# Patient Record
Sex: Male | Born: 1980 | Hispanic: No | Marital: Married | Smoking: Former smoker
Health system: Southern US, Community
[De-identification: ages and names within clinical notes are randomized; demographics above are authoritative.]

## PROBLEM LIST (undated history)

## (undated) DIAGNOSIS — G4733 Obstructive sleep apnea (adult) (pediatric): Principal | ICD-10-CM

## (undated) DIAGNOSIS — R011 Cardiac murmur, unspecified: Secondary | ICD-10-CM

## (undated) DIAGNOSIS — B019 Varicella without complication: Secondary | ICD-10-CM

## (undated) DIAGNOSIS — N529 Male erectile dysfunction, unspecified: Secondary | ICD-10-CM

## (undated) DIAGNOSIS — N200 Calculus of kidney: Secondary | ICD-10-CM

## (undated) HISTORY — PX: CLAVICLE SURGERY: SHX598

## (undated) HISTORY — DX: Obstructive sleep apnea (adult) (pediatric): G47.33

## (undated) HISTORY — DX: Cardiac murmur, unspecified: R01.1

## (undated) HISTORY — DX: Calculus of kidney: N20.0

## (undated) HISTORY — DX: Varicella without complication: B01.9

## (undated) HISTORY — PX: SHOULDER SURGERY: SHX246

## (undated) HISTORY — PX: HERNIA REPAIR: SHX51

## (undated) HISTORY — DX: Male erectile dysfunction, unspecified: N52.9

---

## 1998-05-10 ENCOUNTER — Emergency Department (HOSPITAL_COMMUNITY): Admission: EM | Admit: 1998-05-10 | Discharge: 1998-05-10 | Payer: Self-pay | Admitting: Emergency Medicine

## 1998-05-11 ENCOUNTER — Emergency Department (HOSPITAL_COMMUNITY): Admission: EM | Admit: 1998-05-11 | Discharge: 1998-05-11 | Payer: Self-pay | Admitting: *Deleted

## 1998-05-12 ENCOUNTER — Emergency Department (HOSPITAL_COMMUNITY): Admission: EM | Admit: 1998-05-12 | Discharge: 1998-05-12 | Payer: Self-pay | Admitting: Emergency Medicine

## 1999-01-24 ENCOUNTER — Emergency Department (HOSPITAL_COMMUNITY): Admission: EM | Admit: 1999-01-24 | Discharge: 1999-01-24 | Payer: Self-pay | Admitting: Emergency Medicine

## 1999-01-24 ENCOUNTER — Encounter: Payer: Self-pay | Admitting: Emergency Medicine

## 2003-06-16 ENCOUNTER — Emergency Department (HOSPITAL_COMMUNITY): Admission: EM | Admit: 2003-06-16 | Discharge: 2003-06-16 | Payer: Self-pay | Admitting: Emergency Medicine

## 2006-02-09 ENCOUNTER — Emergency Department (HOSPITAL_COMMUNITY): Admission: EM | Admit: 2006-02-09 | Discharge: 2006-02-09 | Payer: Self-pay | Admitting: Emergency Medicine

## 2006-02-11 ENCOUNTER — Ambulatory Visit (HOSPITAL_COMMUNITY): Admission: RE | Admit: 2006-02-11 | Discharge: 2006-02-11 | Payer: Self-pay | Admitting: Orthopedic Surgery

## 2006-02-12 ENCOUNTER — Ambulatory Visit (HOSPITAL_COMMUNITY): Admission: RE | Admit: 2006-02-12 | Discharge: 2006-02-12 | Payer: Self-pay | Admitting: Orthopedic Surgery

## 2006-03-10 ENCOUNTER — Ambulatory Visit: Payer: Self-pay | Admitting: Internal Medicine

## 2008-07-22 ENCOUNTER — Emergency Department (HOSPITAL_COMMUNITY): Admission: EM | Admit: 2008-07-22 | Discharge: 2008-07-22 | Payer: Self-pay | Admitting: Emergency Medicine

## 2010-08-28 LAB — CBC
HCT: 48.3 % (ref 39.0–52.0)
MCHC: 35.4 g/dL (ref 30.0–36.0)
MCV: 95.1 fL (ref 78.0–100.0)
RBC: 5.08 MIL/uL (ref 4.22–5.81)
WBC: 17.3 10*3/uL — ABNORMAL HIGH (ref 4.0–10.5)

## 2010-08-28 LAB — URINALYSIS, ROUTINE W REFLEX MICROSCOPIC
Bilirubin Urine: NEGATIVE
Ketones, ur: NEGATIVE mg/dL
Leukocytes, UA: NEGATIVE
Nitrite: NEGATIVE
Specific Gravity, Urine: 1.008 (ref 1.005–1.030)
Urobilinogen, UA: 0.2 mg/dL (ref 0.0–1.0)
pH: 5.5 (ref 5.0–8.0)

## 2010-08-28 LAB — COMPREHENSIVE METABOLIC PANEL
BUN: 4 mg/dL — ABNORMAL LOW (ref 6–23)
CO2: 22 mEq/L (ref 19–32)
Calcium: 8.3 mg/dL — ABNORMAL LOW (ref 8.4–10.5)
Chloride: 100 mEq/L (ref 96–112)
Creatinine, Ser: 1 mg/dL (ref 0.4–1.5)
GFR calc non Af Amer: >60 mL/min (ref >60)
Glucose, Bld: 118 mg/dL — ABNORMAL HIGH (ref 70–99)
Total Bilirubin: 0.5 mg/dL (ref 0.3–1.2)

## 2010-08-28 LAB — DIFFERENTIAL
Basophils Absolute: 0 10*3/uL (ref 0.0–0.1)
Eosinophils Relative: 1 % (ref 0–5)
Lymphocytes Relative: 19 % (ref 12–46)
Neutro Abs: 13 10*3/uL — ABNORMAL HIGH (ref 1.7–7.7)
Neutrophils Relative %: 75 % (ref 43–77)
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
## 2011-05-03 ENCOUNTER — Encounter: Payer: Self-pay | Admitting: *Deleted

## 2011-05-03 ENCOUNTER — Emergency Department (HOSPITAL_BASED_OUTPATIENT_CLINIC_OR_DEPARTMENT_OTHER)
Admission: EM | Admit: 2011-05-03 | Discharge: 2011-05-03 | Disposition: A | Discharge status: Home / Self Care | Payer: BC Managed Care – PPO | Attending: Emergency Medicine | Admitting: Emergency Medicine

## 2011-05-03 DIAGNOSIS — K089 Disorder of teeth and supporting structures, unspecified: Secondary | ICD-10-CM | POA: Insufficient documentation

## 2011-05-03 DIAGNOSIS — K029 Dental caries, unspecified: Secondary | ICD-10-CM | POA: Insufficient documentation

## 2011-05-03 MED ORDER — HYDROCODONE-ACETAMINOPHEN 5-500 MG PO TABS: 1.0000 | ORAL_TABLET | Freq: Four times a day (QID) | ORAL | Status: AC | PRN

## 2011-05-03 MED ORDER — CLINDAMYCIN HCL 150 MG PO CAPS
300.0000 mg | ORAL_CAPSULE | Freq: Once | ORAL | Status: AC
  Administered 2011-05-03: 300 mg via ORAL
  Filled 2011-05-03: qty 2

## 2011-05-03 MED ORDER — OXYCODONE-ACETAMINOPHEN 5-325 MG PO TABS
1.0000 | ORAL_TABLET | Freq: Once | ORAL | Status: AC
  Administered 2011-05-03: 1 via ORAL
  Filled 2011-05-03: qty 1

## 2011-05-03 MED ORDER — CLINDAMYCIN HCL 300 MG PO CAPS: 300.0000 mg | ORAL_CAPSULE | Freq: Four times a day (QID) | ORAL | Status: AC

## 2011-05-03 NOTE — ED Notes (Signed)
Pt states he has a couple of bad teeth. Had pain for awhile, but then it resolved. Yesterday woke up with increased swelling and pain on the right lower jaw area.

## 2011-05-03 NOTE — ED Provider Notes (Signed)
History     CSN: [id number] Arrival date & time: 05/03/2011 12:43 AM   First MD Initiated Contact with Patient 05/03/11 0123      Chief Complaint  Patient presents with  . Dental Pain    (Consider location/radiation/quality/duration/timing/severity/associated sxs/prior treatment) Patient is a 30 y.o. male presenting with tooth pain. The history is provided by the patient. No language interpreter was used.  Dental PainThe primary symptoms include dental injury. Primary symptoms do not include mouth pain, oral bleeding, oral lesions, headaches, fever, shortness of breath, sore throat, angioedema or cough. The symptoms began 5 to 7 days ago. The symptoms are worsening. The symptoms are recurrent. The symptoms occur constantly.  Additional symptoms include: dental sensitivity to temperature. Additional symptoms do not include: gum swelling, purulent gums, trismus, trouble swallowing, pain with swallowing, excessive salivation, dry mouth, taste disturbance, hearing loss, nosebleeds and swollen glands.    History reviewed. No pertinent past medical history.  Past Surgical History  Procedure Date  . Shoulder surgery     History reviewed. No pertinent family history.  History  Substance Use Topics  . Smoking status: Current Everyday Smoker  . Smokeless tobacco: Not on file  . Alcohol Use: No      Review of Systems  Constitutional: Negative for fever.  HENT: Negative for hearing loss, nosebleeds, sore throat and trouble swallowing.   Respiratory: Negative for cough and shortness of breath.   Cardiovascular: Negative for chest pain.  Gastrointestinal: Negative for abdominal distention.  Genitourinary: Negative.   Musculoskeletal: Negative for arthralgias.  Skin: Negative.   Neurological: Negative for headaches.  Hematological: Negative.   Psychiatric/Behavioral: Negative.     Allergies  Penicillins  Home Medications   Current Outpatient Rx  Name Route Sig Dispense  Refill  . CLINDAMYCIN HCL 300 MG PO CAPS Oral Take 1 capsule (300 mg total) by mouth every 6 (six) hours. 28 capsule 0  . HYDROCODONE-ACETAMINOPHEN 5-500 MG PO TABS Oral Take 1 tablet by mouth every 6 (six) hours as needed for pain. 10 tablet 0    BP 134/74  Pulse 76  Temp(Src) 98 F (36.7 C) (Oral)  Resp 18  Ht 5\' 11"  (1.803 m)  Wt 188 lb (85.276 kg)  BMI 26.22 kg/m2  SpO2 99%  Physical Exam  Constitutional: He is oriented to person, place, and time. He appears well-developed and well-nourished.  HENT:  Head: Normocephalic and atraumatic.  Mouth/Throat: Abnormal dentition. Dental caries present.    Eyes: EOM are normal. Pupils are equal, round, and reactive to light.  Neck: Normal range of motion. Neck supple.  Cardiovascular: Normal rate and regular rhythm.   Pulmonary/Chest: Effort normal and breath sounds normal. He has no wheezes.  Abdominal: Soft. Bowel sounds are normal. There is no tenderness. There is no rebound and no guarding.  Musculoskeletal: Normal range of motion.  Lymphadenopathy:    He has no cervical adenopathy.  Neurological: He is alert and oriented to person, place, and time.  Skin: Skin is warm and dry.  Psychiatric: Thought content normal.    ED Course  Procedures (including critical care time)  Labs Reviewed - No data to display No results found.   1. Dental caries       MDM  Take all antibiotics follow up with dentist, return for fevers chills facial swelling inability to swallow or tolerate medications or any concerns.  Patient verbalizes understanding and agrees to follow up        Jacorion Klem Smitty Cords, MD 05/03/11 [phone]

## 2011-07-09 ENCOUNTER — Ambulatory Visit (INDEPENDENT_AMBULATORY_CARE_PROVIDER_SITE_OTHER): Payer: BC Managed Care – PPO | Admitting: Family Medicine

## 2011-07-09 DIAGNOSIS — R1084 Generalized abdominal pain: Secondary | ICD-10-CM

## 2011-07-09 DIAGNOSIS — IMO0001 Reserved for inherently not codable concepts without codable children: Secondary | ICD-10-CM

## 2011-07-09 DIAGNOSIS — R1013 Epigastric pain: Secondary | ICD-10-CM

## 2011-07-09 LAB — POCT CBC
Lymph, poc: 2.5 (ref 0.6–3.4)
MCH, POC: 30.2 pg (ref 27–31.2)
MCHC: 34.6 g/dL (ref 31.8–35.4)
MCV: 87.2 fL (ref 80–97)
MID (cbc): 0.6 (ref 0–0.9)
POC LYMPH PERCENT: 37.1 %L (ref 10–50)
Platelet Count, POC: 147 10*3/uL (ref 142–424)
RBC: 5.17 M/uL (ref 4.69–6.13)
WBC: 6.7 10*3/uL (ref 4.6–10.2)

## 2011-07-09 MED ORDER — SUCRALFATE 1 G PO TABS: 1.0000 g | ORAL_TABLET | Freq: Two times a day (BID) | ORAL | Status: DC

## 2011-07-09 NOTE — Progress Notes (Signed)
  Subjective:    Patient ID: Matthew Garcia, male    DOB: Jan 19, 1981, 31 y.o.   MRN: [id number]  Abdominal Pain This is a new problem. The current episode started 1 to 4 weeks ago. The onset quality is gradual. Episode frequency: every morning wakes up with abdominal pain; pain eases as day goes on. The problem has been gradually worsening. The pain is located in the generalized abdominal region. The pain is at a severity of 7/10. The pain is moderate. The quality of the pain is dull (pressure). The abdominal pain radiates to the epigastric region. Pertinent negatives include no fever, nausea or vomiting.    Loose stools without blood tobacco 1 ppd Occ Ibuprofen; no other NSAIDS Occ ETOH Lots of caffiene Diet with spicy or fried foods   Requests STI evaluation; possible exposure Denies penile discharge or testicular tenderness   Review of Systems  Constitutional: Negative for fever, chills, fatigue and unexpected weight change.  Gastrointestinal: Positive for abdominal pain. Negative for nausea, vomiting and blood in stool.       Objective:   Physical Exam  Constitutional: He appears well-developed and well-nourished.  HENT:  Head: Normocephalic and atraumatic.  Eyes: Conjunctivae are normal. Pupils are equal, round, and reactive to light. No scleral icterus.  Neck: Neck supple.  Cardiovascular: Normal rate, regular rhythm and normal heart sounds.   Pulmonary/Chest: Effort normal and breath sounds normal.  Abdominal: There is no hepatosplenomegaly. There is tenderness in the epigastric area. There is no guarding and negative Murphy's sign.     Results for orders placed in visit on 07/09/11  POCT CBC      Component Value Range   WBC 6.7  4.6 - 10.2 (K/uL)   Lymph, poc 2.5  0.6 - 3.4    POC LYMPH PERCENT 37.1  10 - 50 (%L)   MID (cbc) 0.6  0 - 0.9    POC MID % 8.4  0 - 12 (%M)   POC Granulocyte 3.7  2 - 6.9    Granulocyte percent 54.5  37 - 80 (%G)   RBC 5.17  4.69 -  6.13 (M/uL)   Hemoglobin 15.6  14.1 - 18.1 (g/dL)   HCT, POC 40.9  81.1 - 53.7 (%)   MCV 87.2  80 - 97 (fL)   MCH, POC 30.2  27 - 31.2 (pg)   MCHC 34.6  31.8 - 35.4 (g/dL)   RDW, POC 91.4     Platelet Count, POC 147  142 - 424 (K/uL)   MPV 13.3  0 - 99.8 (fL)        Assessment & Plan:   1. Abdominal pain, epigastric  POCT CBC, POCT SEDIMENTATION RATE, H. pylori antibody, IgG, sucralfate (CARAFATE) 1 G tablet  2. Exposure  HIV antibody, RPR, GC/chlamydia probe amp, urine   Anticipatory guidance  Reviewed food choices and need for smoking cessation

## 2011-07-10 LAB — GC/CHLAMYDIA PROBE AMP, URINE: GC Probe Amp, Urine: NEGATIVE

## 2011-07-10 LAB — HIV ANTIBODY (ROUTINE TESTING W REFLEX): HIV: NONREACTIVE

## 2011-07-10 NOTE — Progress Notes (Signed)
Quick Note:  Please call patient with normal labs to date. Some still pending.Thanks! KR  ______ 

## 2011-07-19 NOTE — Progress Notes (Signed)
Quick Note:  Please send patient copy of normal labs. Thanks! KR  ______ 

## 2011-07-20 ENCOUNTER — Encounter: Payer: Self-pay | Admitting: *Deleted

## 2011-09-25 ENCOUNTER — Encounter (HOSPITAL_COMMUNITY): Payer: Self-pay | Admitting: *Deleted

## 2011-09-25 ENCOUNTER — Emergency Department (HOSPITAL_COMMUNITY)
Admission: EM | Admit: 2011-09-25 | Discharge: 2011-09-26 | Disposition: A | Discharge status: Home / Self Care | Payer: BC Managed Care – PPO | Attending: Emergency Medicine | Admitting: Emergency Medicine

## 2011-09-25 DIAGNOSIS — R10811 Right upper quadrant abdominal tenderness: Secondary | ICD-10-CM | POA: Insufficient documentation

## 2011-09-25 DIAGNOSIS — K2970 Gastritis, unspecified, without bleeding: Secondary | ICD-10-CM

## 2011-09-25 DIAGNOSIS — F17200 Nicotine dependence, unspecified, uncomplicated: Secondary | ICD-10-CM | POA: Insufficient documentation

## 2011-09-25 DIAGNOSIS — R10816 Epigastric abdominal tenderness: Secondary | ICD-10-CM | POA: Insufficient documentation

## 2011-09-25 DIAGNOSIS — R109 Unspecified abdominal pain: Secondary | ICD-10-CM | POA: Insufficient documentation

## 2011-09-25 NOTE — ED Notes (Signed)
Pt c/o RUQ, LUQ, and epigastric pain. Pt c/o chronicity x 1.5 months, normally experiences pain in early mornings, tonight pain onset after eating dinner. Pt denies n/v @ this time.

## 2011-09-26 LAB — COMPREHENSIVE METABOLIC PANEL
ALT: 27 U/L (ref 0–53)
Albumin: 4 g/dL (ref 3.5–5.2)
Alkaline Phosphatase: 62 U/L (ref 39–117)
Potassium: 3.4 mEq/L — ABNORMAL LOW (ref 3.5–5.1)
Sodium: 138 mEq/L (ref 135–145)
Total Protein: 6.6 g/dL (ref 6.0–8.3)

## 2011-09-26 LAB — DIFFERENTIAL
Basophils Absolute: 0 10*3/uL (ref 0.0–0.1)
Lymphocytes Relative: 31 % (ref 12–46)
Monocytes Relative: 9 % (ref 3–12)

## 2011-09-26 LAB — CBC
HCT: 39.3 % (ref 39.0–52.0)
Platelets: 138 10*3/uL — ABNORMAL LOW (ref 150–400)
RDW: 12.2 % (ref 11.5–15.5)
WBC: 8.2 10*3/uL (ref 4.0–10.5)

## 2011-09-26 MED ORDER — ONDANSETRON HCL 4 MG/2ML IJ SOLN
4.0000 mg | Freq: Once | INTRAMUSCULAR | Status: AC
  Administered 2011-09-26: 4 mg via INTRAVENOUS
  Filled 2011-09-26: qty 2

## 2011-09-26 MED ORDER — OMEPRAZOLE 20 MG PO CPDR: 20.0000 mg | DELAYED_RELEASE_CAPSULE | Freq: Every day | ORAL | Status: DC

## 2011-09-26 MED ORDER — OXYCODONE-ACETAMINOPHEN 5-325 MG PO TABS: 1.0000 | ORAL_TABLET | Freq: Four times a day (QID) | ORAL | Status: AC | PRN

## 2011-09-26 MED ORDER — GI COCKTAIL ~~LOC~~
30.0000 mL | Freq: Once | ORAL | Status: AC
  Administered 2011-09-26: 30 mL via ORAL
  Filled 2011-09-26: qty 30

## 2011-09-26 MED ORDER — PANTOPRAZOLE SODIUM 40 MG IV SOLR
40.0000 mg | Freq: Once | INTRAVENOUS | Status: AC
  Administered 2011-09-26: 40 mg via INTRAVENOUS
  Filled 2011-09-26: qty 40

## 2011-09-26 MED ORDER — FAMOTIDINE 40 MG PO TABS: 40.0000 mg | ORAL_TABLET | Freq: Every day | ORAL | Status: DC

## 2011-09-26 MED ORDER — MORPHINE SULFATE 4 MG/ML IJ SOLN
4.0000 mg | Freq: Once | INTRAMUSCULAR | Status: AC
  Administered 2011-09-26: 4 mg via INTRAVENOUS
  Filled 2011-09-26: qty 1

## 2011-09-26 MED ORDER — SODIUM CHLORIDE 0.9 % IV SOLN
1000.0000 mL | Freq: Once | INTRAVENOUS | Status: AC
  Administered 2011-09-26: 1000 mL via INTRAVENOUS

## 2011-09-26 MED ORDER — SODIUM CHLORIDE 0.9 % IV BOLUS (SEPSIS): 1000.0000 mL | Freq: Once | INTRAVENOUS | Status: DC

## 2011-09-26 NOTE — ED Provider Notes (Signed)
Medical screening examination/treatment/procedure(s) were performed by non-physician practitioner and as supervising physician I was immediately available for consultation/collaboration.   Audria Takeshita L Magdelene Ruark, MD 09/26/11 0547 

## 2011-09-26 NOTE — ED Provider Notes (Signed)
History     CSN: [id number]  Arrival date & time 09/25/11  2306   First MD Initiated Contact with Patient 09/26/11 0122      Chief Complaint  Patient presents with  . Abdominal Pain    HPI  History provided by the patient and significant other. Patient is a 31 year old male with no significant past medical history who presents with complaints of upper epigastric abdominal pains that began around 7 PM last evening. This seemed to start after eating. Patient reports occasionally having similar symptoms in the past over the past one to 2 months. Pain is waxing and waning. Patient's significant other states that she does notice that he belches significantly after meals. Patient denies feeling indigestion or heartburn type symptoms. He denies any nausea vomiting. Patient denies any fever, chills, sweats. No diarrhea or constipation. Patient has not taken anything for his pain.    History reviewed. No pertinent past medical history.  Past Surgical History  Procedure Date  . Shoulder surgery     History reviewed. No pertinent family history.  History  Substance Use Topics  . Smoking status: Current Everyday Smoker -- 1.0 packs/day    Types: Cigarettes  . Smokeless tobacco: Not on file  . Alcohol Use: Yes      Review of Systems  Constitutional: Negative for fever and chills.  Respiratory: Negative for shortness of breath.   Cardiovascular: Negative for chest pain.  Gastrointestinal: Positive for abdominal pain. Negative for nausea, vomiting, diarrhea and constipation.  Genitourinary: Negative for flank pain.    Allergies  Penicillins  Home Medications   Current Outpatient Rx  Name Route Sig Dispense Refill  . SUCRALFATE 1 G PO TABS Oral Take 1 tablet (1 g total) by mouth 2 (two) times daily. 30 tablet 1    BP 147/89  Pulse 59  Temp(Src) 98.7 F (37.1 C) (Oral)  Resp 16  SpO2 99%  Physical Exam  Nursing note and vitals reviewed. Constitutional: He is oriented  to person, place, and time. He appears well-developed and well-nourished. No distress.  HENT:  Head: Normocephalic and atraumatic.  Cardiovascular: Normal rate and regular rhythm.   Pulmonary/Chest: Effort normal and breath sounds normal.  Abdominal: Soft. There is tenderness in the right upper quadrant and epigastric area. There is no rebound, no guarding, no CVA tenderness, no tenderness at McBurney's point and negative Murphy's sign.  Neurological: He is alert and oriented to person, place, and time.  Skin: Skin is warm.  Psychiatric: He has a normal mood and affect. His behavior is normal.    ED Course  Procedures   Results for orders placed during the hospital encounter of 09/25/11  CBC      Component Value Range   WBC 8.2  4.0 - 10.5 (K/uL)   RBC 4.67  4.22 - 5.81 (MIL/uL)   Hemoglobin 14.4  13.0 - 17.0 (g/dL)   HCT 08.6  57.8 - 46.9 (%)   MCV 84.2  78.0 - 100.0 (fL)   MCH 30.8  26.0 - 34.0 (pg)   MCHC 36.6 (*) 30.0 - 36.0 (g/dL)   RDW 62.9  52.8 - 41.3 (%)   Platelets 138 (*) 150 - 400 (K/uL)  DIFFERENTIAL      Component Value Range   Neutrophils Relative 59  43 - 77 (%)   Lymphocytes Relative 31  12 - 46 (%)   Monocytes Relative 9  3 - 12 (%)   Eosinophils Relative 1  0 - 5 (%)  Basophils Relative 0  0 - 1 (%)   Neutro Abs 4.9  1.7 - 7.7 (K/uL)   Lymphs Abs 2.5  0.7 - 4.0 (K/uL)   Monocytes Absolute 0.7  0.1 - 1.0 (K/uL)   Eosinophils Absolute 0.1  0.0 - 0.7 (K/uL)   Basophils Absolute 0.0  0.0 - 0.1 (K/uL)   WBC Morphology ATYPICAL LYMPHOCYTES     Smear Review LARGE PLATELETS PRESENT    COMPREHENSIVE METABOLIC PANEL      Component Value Range   Sodium 138  135 - 145 (mEq/L)   Potassium 3.4 (*) 3.5 - 5.1 (mEq/L)   Chloride 102  96 - 112 (mEq/L)   CO2 25  19 - 32 (mEq/L)   Glucose, Bld 113 (*) 70 - 99 (mg/dL)   BUN 10  6 - 23 (mg/dL)   Creatinine, Ser 1.19  0.50 - 1.35 (mg/dL)   Calcium 9.2  8.4 - 14.7 (mg/dL)   Total Protein 6.6  6.0 - 8.3 (g/dL)   Albumin  4.0  3.5 - 5.2 (g/dL)   AST 24  0 - 37 (U/L)   ALT 27  0 - 53 (U/L)   Alkaline Phosphatase 62  39 - 117 (U/L)   Total Bilirubin 0.3  0.3 - 1.2 (mg/dL)   GFR calc non Af Amer >90  >90 (mL/min)   GFR calc Af Amer >90  >90 (mL/min)  LIPASE, BLOOD      Component Value Range   Lipase 28  11 - 59 (U/L)       1. Abdominal pain   2. Gastritis       MDM  1:50 a.m. patient seen and evaluated. Patient no acute distress.  Patient had a slight improvement of pains. Still having some pain will give dose of morphine.   Labs are back without any significant findings. I discussed this with patient. Having some improvement of symptoms. At this time we discussed options for CT scan but do not feel this would be warranted. Patient's symptoms are more likely related to gastritis possible pedicles her disease. Patient will begin treatment for this outpatient and return in 24 hours for recheck of symptoms.      Angus Seller, [place] 09/26/11 [phone]

## 2011-09-26 NOTE — Discharge Instructions (Signed)
You were seen and evaluated today for your symptoms of abdominal pain. Your lab tests today have not shown any signs for concerning or emergent cause your symptoms. At this time your providers feel the most likely causes irritation of your stomach or possible stomach ulcer. You've been given prescriptions for an acid medications taken regularly to help with your symptoms. It is recommended you keep a simple diet of bland foods and small portions. Please followup to primary care provider or GI specialist for continued evaluation and treatment. Return to emergency room for recheck her symptoms in 24-48 hours or return sooner if you develop any increasing pain, fever, chills, persistent nausea vomiting.   Abdominal Pain Abdominal pain can be caused by many things. Your caregiver decides the seriousness of your pain by an examination and possibly blood tests and X-rays. Many cases can be observed and treated at home. Most abdominal pain is not caused by a disease and will probably improve without treatment. However, in many cases, more time must pass before a clear cause of the pain can be found. Before that point, it may not be known if you need more testing, or if hospitalization or surgery is needed. HOME CARE INSTRUCTIONS   Do not take laxatives unless directed by your caregiver.   Take pain medicine only as directed by your caregiver.   Only take over-the-counter or prescription medicines for pain, discomfort, or fever as directed by your caregiver.   Try a clear liquid diet (broth, tea, or water) for as long as directed by your caregiver. Slowly move to a bland diet as tolerated.  SEEK IMMEDIATE MEDICAL CARE IF:   The pain does not go away.   You have a fever.   You keep throwing up (vomiting).   The pain is felt only in portions of the abdomen. Pain in the right side could possibly be appendicitis. In an adult, pain in the left lower portion of the abdomen could be colitis or diverticulitis.    You pass bloody or black tarry stools.  MAKE SURE YOU:   Understand these instructions.   Will watch your condition.   Will get help right away if you are not doing well or get worse.  Document Released: 02/11/2005 Document Revised: 04/23/2011 Document Reviewed: 12/21/2007 [facility] Patient Information 2012 [place], [place].   Gastritis Gastritis is an inflammation (the body's way of reacting to injury and/or infection) of the stomach. It is often caused by viral or bacterial (germ) infections. It can also be caused by chemicals (including alcohol) and medications. This illness may be associated with generalized malaise (feeling tired, not well), cramps, and fever. The illness may last 2 to 7 days. If symptoms of gastritis continue, gastroscopy (looking into the stomach with a telescope-like instrument), biopsy (taking tissue samples), and/or blood tests may be necessary to determine the cause. Antibiotics will not affect the illness unless there is a bacterial infection present. One common bacterial cause of gastritis is an organism known as H. Pylori. This can be treated with antibiotics. Other forms of gastritis are caused by too much acid in the stomach. They can be treated with medications such as H2 blockers and antacids. Home treatment is usually all that is needed. Young children will quickly become dehydrated (loss of body fluids) if vomiting and diarrhea are both present. Medications may be given to control nausea. Medications are usually not given for diarrhea unless especially bothersome. Some medications slow the removal of the virus from the gastrointestinal tract. This  slows down the healing process. HOME CARE INSTRUCTIONS Home care instructions for nausea and vomiting:  For adults: drink small amounts of fluids often. Drink at least 2 quarts a day. Take sips frequently. Do not drink large amounts of fluid at one time. This may worsen the nausea.   Only take over-the-counter or  prescription medicines for pain, discomfort, or fever as directed by your caregiver.   Drink clear liquids only. Those are anything you can see through such as water, broth, or soft drinks.   Once you are keeping clear liquids down, you may start full liquids, soups, juices, and ice cream or sherbet. Slowly add bland (plain, not spicy) foods to your diet.  Home care instructions for diarrhea:  Diarrhea can be caused by bacterial infections or a virus. Your condition should improve with time, rest, fluids, and/or anti-diarrheal medication.   Until your diarrhea is under control, you should drink clear liquids often in small amounts. Clear liquids include: water, broth, jell-o water and weak tea.  Avoid:  Milk.   Fruits.   Tobacco.   Alcohol.   Extremely hot or cold fluids.   Too much intake of anything at one time.  When your diarrhea stops you may add the following foods, which help the stool to become more formed:  Rice.   Bananas.   Apples without skin.   Dry toast.  Once these foods are tolerated you may add low-fat yogurt and low-fat cottage cheese. They will help to restore the normal bacterial balance in your bowel. Wash your hands well to avoid spreading bacteria (germ) or virus. SEEK IMMEDIATE MEDICAL CARE IF:   You are unable to keep fluids down.   Vomiting or diarrhea become persistent (constant).   Abdominal pain develops, increases, or localizes. (Right sided pain can be appendicitis. Left sided pain in adults can be diverticulitis.)   You develop a fever (an oral temperature above 102 F (38.9 C)).   Diarrhea becomes excessive or contains blood or mucus.   You have excessive weakness, dizziness, fainting or extreme thirst.   You are not improving or you are getting worse.   You have any other questions or concerns.  Document Released: 04/28/2001 Document Revised: 04/23/2011 Document Reviewed: 05/04/2005 [facility] Patient Information 2012 [place],  [place].    RESOURCE GUIDE  Dental Problems  Patients with Medicaid: [facility] [phone] [address]                                           [phone] [address] Phone:  [phone]                                                  Phone:  [phone]  If unable to pay or uninsured, contact:  [facility] or [facility]. to become qualified for the adult dental clinic.  Chronic Pain Problems Contact [facility]  [phone] Patients need to be referred by their primary care doctor.  Insufficient Money for Medicine Contact [employer]:  call "211" or [facility] (904)321-2389.  No Primary Care Doctor  Call [facility]  [phone] Other agencies that provide inexpensive medical care    [facility]  [phone]    [facility]  [phone]    [facility]  [phone]    [facility]  [phone]    [facility]  [phone]    [facility]  [phone]  Psychological Services [facility]  [phone] [facility]  [phone] [facility]   [phone] (emergency services ([phone])  Substance Abuse Resources [facility]  502-645-1297 [facility] (534)760-7429 [facility] [phone] Floydene Flock 646-050-2353 Residential & Outpatient Substance Abuse Program  ([phone]  Abuse/Neglect [facility] Child Abuse Hotline 260-280-9645 [facility] Child Abuse Hotline 929-735-0923 (After Hours)  Emergency Shelter [facility] 915-244-2472  Maternity Homes [facility] 509-380-0200 [facility] (989)026-8990  MRSA Hotline #:   (281)654-0909    [facility] Resources  [facility]     [employer]                          [facility] Dept. [address] [place]                       [address]      [address]  Blondell Reveal Phone:  [phone]                                   Phone:  [phone]                 Phone:  [phone]  [facility] Mental Health Phone:  [phone]  [facility] Child Abuse Hotline (505)852-0836 [phone] (After Hours)

## 2011-09-30 ENCOUNTER — Emergency Department (HOSPITAL_BASED_OUTPATIENT_CLINIC_OR_DEPARTMENT_OTHER): Payer: BC Managed Care – PPO

## 2011-09-30 ENCOUNTER — Encounter (HOSPITAL_BASED_OUTPATIENT_CLINIC_OR_DEPARTMENT_OTHER): Payer: Self-pay

## 2011-09-30 ENCOUNTER — Emergency Department (HOSPITAL_BASED_OUTPATIENT_CLINIC_OR_DEPARTMENT_OTHER)
Admission: EM | Admit: 2011-09-30 | Discharge: 2011-09-30 | Disposition: A | Discharge status: Home / Self Care | Payer: BC Managed Care – PPO | Attending: Emergency Medicine | Admitting: Emergency Medicine

## 2011-09-30 DIAGNOSIS — F17200 Nicotine dependence, unspecified, uncomplicated: Secondary | ICD-10-CM | POA: Insufficient documentation

## 2011-09-30 DIAGNOSIS — K2970 Gastritis, unspecified, without bleeding: Secondary | ICD-10-CM | POA: Insufficient documentation

## 2011-09-30 DIAGNOSIS — R1011 Right upper quadrant pain: Secondary | ICD-10-CM | POA: Insufficient documentation

## 2011-09-30 DIAGNOSIS — K2990 Gastroduodenitis, unspecified, without bleeding: Secondary | ICD-10-CM | POA: Insufficient documentation

## 2011-09-30 DIAGNOSIS — R1013 Epigastric pain: Secondary | ICD-10-CM

## 2011-09-30 LAB — COMPREHENSIVE METABOLIC PANEL
ALT: 20 U/L (ref 0–53)
AST: 20 U/L (ref 0–37)
Alkaline Phosphatase: 61 U/L (ref 39–117)
CO2: 30 mEq/L (ref 19–32)
Calcium: 9.3 mg/dL (ref 8.4–10.5)
GFR calc Af Amer: >90 mL/min (ref >90)
GFR calc non Af Amer: >90 mL/min (ref >90)
Glucose, Bld: 111 mg/dL — ABNORMAL HIGH (ref 70–99)
Potassium: 3.9 mEq/L (ref 3.5–5.1)
Sodium: 139 mEq/L (ref 135–145)
Total Protein: 6.6 g/dL (ref 6.0–8.3)

## 2011-09-30 LAB — DIFFERENTIAL
Basophils Absolute: 0 10*3/uL (ref 0.0–0.1)
Lymphocytes Relative: 26 % (ref 12–46)
Lymphs Abs: 1.6 10*3/uL (ref 0.7–4.0)
Neutrophils Relative %: 65 % (ref 43–77)

## 2011-09-30 LAB — CBC
Platelets: 151 10*3/uL (ref 150–400)
RBC: 4.87 MIL/uL (ref 4.22–5.81)
RDW: 12 % (ref 11.5–15.5)
WBC: 6.2 10*3/uL (ref 4.0–10.5)

## 2011-09-30 LAB — LIPASE, BLOOD: Lipase: 27 U/L (ref 11–59)

## 2011-09-30 MED ORDER — HYDROMORPHONE HCL PF 1 MG/ML IJ SOLN
1.0000 mg | Freq: Once | INTRAMUSCULAR | Status: AC
  Administered 2011-09-30: 1 mg via INTRAVENOUS
  Filled 2011-09-30: qty 1

## 2011-09-30 MED ORDER — ONDANSETRON HCL 4 MG/2ML IJ SOLN
4.0000 mg | Freq: Once | INTRAMUSCULAR | Status: AC
  Administered 2011-09-30: 4 mg via INTRAVENOUS
  Filled 2011-09-30: qty 2

## 2011-09-30 MED ORDER — SODIUM CHLORIDE 0.9 % IV BOLUS (SEPSIS)
1000.0000 mL | Freq: Once | INTRAVENOUS | Status: AC
  Administered 2011-09-30: 1000 mL via INTRAVENOUS

## 2011-09-30 NOTE — ED Provider Notes (Addendum)
History     CSN: [id number]  Arrival date & time 09/30/11  4098   First MD Initiated Contact with Patient 09/30/11 [phone]      Chief Complaint  Patient presents with  . Abdominal Pain    (Consider location/radiation/quality/duration/timing/severity/associated sxs/prior treatment) Patient is a 31 y.o. male presenting with abdominal pain. The history is provided by the patient and a relative.  Abdominal Pain The primary symptoms of the illness include abdominal pain. The primary symptoms of the illness do not include fever, shortness of breath, nausea, vomiting, diarrhea or dysuria. Episode onset: 2 months ago. The onset of the illness was gradual. Progression since onset: Waxing and waning but worse over the last 1-2 weeks.  The abdominal pain is located in the RUQ and epigastric region. The abdominal pain radiates to the back. The severity of the abdominal pain is 8/10. The abdominal pain is relieved by nothing. The abdominal pain is exacerbated by certain positions (Seems to be worse at night when he's lying down but also worse after eating at times).  Associated with: Denies alcohol use, NSAID use, recent antibiotics. The patient has not had a change in bowel habit. Symptoms associated with the illness do not include anorexia, urgency or frequency. Significant associated medical issues include GERD and gallstones.    History reviewed. No pertinent past medical history.  Past Surgical History  Procedure Date  . Shoulder surgery     No family history on file.  History  Substance Use Topics  . Smoking status: Current Everyday Smoker -- 1.0 packs/day    Types: Cigarettes  . Smokeless tobacco: Not on file  . Alcohol Use: Yes      Review of Systems  Constitutional: Negative for fever.  Respiratory: Negative for shortness of breath.   Gastrointestinal: Positive for abdominal pain. Negative for nausea, vomiting, diarrhea and anorexia.  Genitourinary: Negative for dysuria, urgency  and frequency.  All other systems reviewed and are negative.    Allergies  Penicillins  Home Medications   Current Outpatient Rx  Name Route Sig Dispense Refill  . FAMOTIDINE 40 MG PO TABS Oral Take 1 tablet (40 mg total) by mouth daily. 30 tablet 0  . OMEPRAZOLE 20 MG PO CPDR Oral Take 1 capsule (20 mg total) by mouth daily. 30 capsule 0  . OXYCODONE-ACETAMINOPHEN 5-325 MG PO TABS Oral Take 1-2 tablets by mouth every 6 (six) hours as needed for pain. 20 tablet 0  . SUCRALFATE 1 G PO TABS Oral Take 1 tablet (1 g total) by mouth 2 (two) times daily. 30 tablet 1    BP 131/90  Pulse 57  Temp(Src) 97.9 F (36.6 C) (Oral)  Resp 20  SpO2 100%  Physical Exam  Nursing note and vitals reviewed. Constitutional: He is oriented to person, place, and time. He appears well-developed and well-nourished. No distress.  HENT:  Head: Normocephalic and atraumatic.  Mouth/Throat: Oropharynx is clear and moist.  Eyes: Conjunctivae and EOM are normal. Pupils are equal, round, and reactive to light.  Neck: Normal range of motion. Neck supple.  Cardiovascular: Normal rate, regular rhythm and intact distal pulses.   No murmur heard. Pulmonary/Chest: Effort normal and breath sounds normal. No respiratory distress. He has no wheezes. He has no rales.  Abdominal: Soft. Normal appearance. He exhibits no distension. There is tenderness in the right upper quadrant and epigastric area. There is guarding. There is no rebound and no CVA tenderness. No hernia.  Musculoskeletal: Normal range of motion. He exhibits no  edema and no tenderness.  Neurological: He is alert and oriented to person, place, and time.  Skin: Skin is warm and dry. No rash noted. No erythema.  Psychiatric: He has a normal mood and affect. His behavior is normal.    ED Course  Procedures (including critical care time)  Labs Reviewed  CBC - Abnormal; Notable for the following:    MCHC 36.9 (*)    All other components within normal  limits  COMPREHENSIVE METABOLIC PANEL - Abnormal; Notable for the following:    Glucose, Bld 111 (*)    All other components within normal limits  DIFFERENTIAL  LIPASE, BLOOD   US Abdomen Complete  09/30/2011  *RADIOLOGY REPORT*  Clinical Data:  Abdominal pain.  COMPLETE ABDOMINAL ULTRASOUND  Comparison:  None.  Findings:  Gallbladder:  No gallstones, gallbladder wall thickening, or pericholecystic fluid.  Common bile duct:  Measures 0.7 cm.  Liver:  No focal lesion identified.  Within normal limits in parenchymal echogenicity.Small amount of free fluid in Morison's pouch is noted.  IVC:  Appears normal.  Pancreas:  No focal abnormality seen.  Spleen:  Measures 7.4 cm and appears normal.  Right Kidney:  Measures measures 11.4 cm.  There is a 1.0 cm hyperechoic focus with posterior shadowing consistent with a nonobstructing stone.  No mass or hydronephrosis.  Left Kidney:  Measures 12.1 cm.  No stone or mass.  A prominent column of Bertin is noted.  Abdominal aorta:  No aneurysm identified.  IMPRESSION:  1.  Prominent common bile duct measures 0.7 cm.  Recommend correlation with liver function tests. 2.  Small amount of free fluid in Morison's pouch.  Source of fluid is not identified. 3.  1 cm nonobstructing stone right kidney. 4.  Normal-appearing gallbladder.  Original Report Authenticated By: Bernadene Bell. D'ALESSIO, M.D.     1. Gastritis   2. Epigastric pain       MDM   Returning with persistent epigastric and right upper quadrant pain that radiates into his back. He states the pain has been waxing and waning over the last 2 months but is worsened in the last week. He was seen at [facility] 5 days ago was diagnosed with gastritis. He had normal labs but no imaging was done. He was given Pepcid, Prilosec and Carafate which he is not taking regularly because he states he has not like taking medication.  However he has a history of when he was a child they told him he had a gallstone. On exam  patient has no significant epigastric tenderness with some right upper cautery tenderness. He has no peritoneal signs and he is well-appearing with normal vital signs. He denies any heavy alcohol use, NSAIDs or other irritating substances to the stomach.  Symptoms are most consistent with gastritis however given his prior childhood history will get an ultrasound to rule out gallstones.  7:55 AM All labs are wnl (CBC, CMP, lipase).  Pt states despite getting dilaudid his pain is no better.  8:52 AM Patient pain is now improved without further indications given. Ultrasound results showed a prominent common bile duct but normal LFTs and a normal gallbladder. He does have a 1 cm nonobstructing kidney stone. He's not displaying symptoms concerning for a nephrolithiasis. Discussed with him the results he is going to try to take the Pepcid and Prilosec regularly. And he was given referral to GI for an endoscopy for further evaluation.     Gwyneth Sprout, MD 09/30/11 1610  Gwyneth Sprout,  MD 09/30/11 7829  Gwyneth Sprout, MD 09/30/11 5621

## 2011-09-30 NOTE — ED Notes (Signed)
Pt returned from ultrasound

## 2011-09-30 NOTE — ED Notes (Signed)
Pt sts pain in abd mid center goes to back Hx of stone as child,pain worst today,sharp,constant

## 2011-09-30 NOTE — ED Notes (Signed)
Patient transported to Ultrasound 

## 2011-09-30 NOTE — ED Notes (Signed)
Report received from Pam., RN and care assumed.

## 2011-10-15 ENCOUNTER — Other Ambulatory Visit (HOSPITAL_COMMUNITY): Payer: Self-pay | Admitting: Gastroenterology

## 2011-10-15 DIAGNOSIS — R1013 Epigastric pain: Secondary | ICD-10-CM

## 2011-10-21 ENCOUNTER — Encounter (HOSPITAL_COMMUNITY)
Admission: RE | Admit: 2011-10-21 | Discharge: 2011-10-21 | Disposition: A | Discharge status: Home / Self Care | Payer: BC Managed Care – PPO | Source: Ambulatory Visit | Attending: Gastroenterology | Admitting: Gastroenterology

## 2011-10-21 ENCOUNTER — Other Ambulatory Visit (HOSPITAL_COMMUNITY): Payer: Self-pay | Admitting: Gastroenterology

## 2011-10-21 DIAGNOSIS — R1013 Epigastric pain: Secondary | ICD-10-CM

## 2011-10-21 MED ORDER — TECHNETIUM TC 99M MEBROFENIN IV KIT
5.0000 | PACK | Freq: Once | INTRAVENOUS | Status: AC | PRN
  Administered 2011-10-21: 5 via INTRAVENOUS

## 2011-10-21 MED ORDER — MORPHINE SULFATE 4 MG/ML IJ SOLN
3.3600 mg | Freq: Once | INTRAMUSCULAR | Status: AC
  Administered 2011-10-21: 3.36 mg via INTRAVENOUS

## 2011-10-21 MED ORDER — MORPHINE SULFATE 4 MG/ML IJ SOLN
INTRAMUSCULAR | Status: AC
  Filled 2011-10-21: qty 1

## 2011-10-22 ENCOUNTER — Encounter (INDEPENDENT_AMBULATORY_CARE_PROVIDER_SITE_OTHER): Payer: Self-pay | Admitting: General Surgery

## 2011-11-13 ENCOUNTER — Ambulatory Visit (INDEPENDENT_AMBULATORY_CARE_PROVIDER_SITE_OTHER): Payer: BC Managed Care – PPO | Admitting: Surgery

## 2011-11-13 ENCOUNTER — Encounter (INDEPENDENT_AMBULATORY_CARE_PROVIDER_SITE_OTHER): Payer: Self-pay | Admitting: Surgery

## 2011-11-13 VITALS — BP 116/72 | HR 74 | Temp 98.6°F | Resp 16 | Ht 70.0 in | Wt 186.5 lb

## 2011-11-13 DIAGNOSIS — K819 Cholecystitis, unspecified: Secondary | ICD-10-CM

## 2011-11-13 MED ORDER — HYDROCODONE-ACETAMINOPHEN 5-325 MG PO TABS: 1.0000 | ORAL_TABLET | Freq: Four times a day (QID) | ORAL | Status: AC | PRN

## 2011-11-13 NOTE — Patient Instructions (Addendum)
Laparoscopic Cholecystectomy Laparoscopic cholecystectomy is surgery to remove the gallbladder. The gallbladder is located slightly to the right of center in the abdomen, behind the liver. It is a concentrating and storage sac for the bile produced in the liver. Bile aids in the digestion and absorption of fats. Gallbladder disease (cholecystitis) is an inflammation of your gallbladder. This condition is usually caused by a buildup of gallstones (cholelithiasis) in your gallbladder. Gallstones can block the flow of bile, resulting in inflammation and pain. In severe cases, emergency surgery may be required. When emergency surgery is not required, you will have time to prepare for the procedure. Laparoscopic surgery is an alternative to open surgery. Laparoscopic surgery usually has a shorter recovery time. Your common bile duct may also need to be examined and explored. Your caregiver will discuss this with you if he or she feels this should be done. If stones are found in the common bile duct, they may be removed. LET YOUR CAREGIVER KNOW ABOUT:  Allergies to food or medicine.   Medicines taken, including vitamins, herbs, eyedrops, over-the-counter medicines, and creams.   Use of steroids (by mouth or creams).   Previous problems with anesthetics or numbing medicines.   History of bleeding problems or blood clots.   Previous surgery.   Other health problems, including diabetes and kidney problems.   Possibility of pregnancy, if this applies.  RISKS AND COMPLICATIONS All surgery is associated with risks. Some problems that may occur following this procedure include:  Infection.   Damage to the common bile duct, nerves, arteries, veins, or other internal organs such as the stomach or intestines.   Bleeding.   A stone may remain in the common bile duct.  BEFORE THE PROCEDURE  Do not take aspirin for 3 days prior to surgery or blood thinners for 1 week prior to surgery.   Do not eat or  drink anything after midnight the night before surgery.   Let your caregiver know if you develop a cold or other infectious problem prior to surgery.   You should be present 60 minutes before the procedure or as directed.  PROCEDURE  You will be given medicine that makes you sleep (general anesthetic). When you are asleep, your surgeon will make several small cuts (incisions) in your abdomen. One of these incisions is used to insert a small, lighted scope (laparoscope) into the abdomen. The laparoscope helps the surgeon see into your abdomen. Carbon dioxide gas will be pumped into your abdomen. The gas allows more room for the surgeon to perform your surgery. Other operating instruments are inserted through the other incisions. Laparoscopic procedures may not be appropriate when:  There is major scarring from previous surgery.   The gallbladder is extremely inflamed.   There are bleeding disorders or unexpected cirrhosis of the liver.   A pregnancy is near term.   Other conditions make the laparoscopic procedure impossible.  If your surgeon feels it is not safe to continue with a laparoscopic procedure, he or she will perform an open abdominal procedure. In this case, the surgeon will make an incision to open the abdomen. This gives the surgeon a larger view and field to work within. This may allow the surgeon to perform procedures that sometimes cannot be performed with a laparoscope alone. Open surgery has a longer recovery time. AFTER THE PROCEDURE  You will be taken to the recovery area where a nurse will watch and check your progress.   You may be allowed to go home   the same day.   Do not resume physical activities until directed by your caregiver.   You may resume a normal diet and activities as directed.  Document Released: 05/04/2005 Document Revised: 04/23/2011 Document Reviewed: 10/17/2010 [facility] Patient Information 2012 [place], [place].  Thanks for your patience.  If you  need further assistance after leaving the office, please call our office and speak with French Ana A.  [phone].  If you want to leave a message for Dr. Daphine Deutscher, please call his office phone at [phone].

## 2011-11-13 NOTE — Addendum Note (Signed)
Addended by: Latricia Heft on: 11/13/2011 03:49 PM   Modules accepted: Orders

## 2011-11-13 NOTE — Progress Notes (Signed)
Chief Complaint:  Upper abdominal pain and non viz HIDA  History of Present Illness:  Matthew Garcia is an 31 y.o. male referred by Dr. Evette Cristal.  The patient and his wife and daughter came in with him from [facility] for a gallbladder consult. He been seen in the emergency room with severe abdominal pain stomach pains after eating fried chicken wings. He's been having several months of this epigastric pain often times at night. He had a gallbladder ultrasound which was normal for stones except showing nonobstructing kidney stones right kidney which he apparently had for several years. And a HIDA scan was obtained which was non-his consistent with obstruction of the cystic duct.  The patient is feeling better and ask about nonoperative management. I told him I would try to avoid eating fatty foods and be very careful about his diet. If he starts having problems then we will need to schedule him for laparoscopic cholecystectomy. With a history and physical he is ready to be scheduled by our very pleasant schedule people.    Past Medical History  Diagnosis Date  . Heart murmur   . Kidney stones     Past Surgical History  Procedure Date  . Shoulder surgery   . Hernia repair   . Clavicle surgery     Current Outpatient Prescriptions  Medication Sig Dispense Refill  . famotidine (PEPCID) 40 MG tablet Take 1 tablet (40 mg total) by mouth daily.  30 tablet  0  . omeprazole (PRILOSEC) 20 MG capsule Take 1 capsule (20 mg total) by mouth daily.  30 capsule  0  . oxyCODONE-acetaminophen (PERCOCET) 5-325 MG per tablet Take 1 tablet by mouth every 4 (four) hours as needed.      . sucralfate (CARAFATE) 1 G tablet Take 1 tablet (1 g total) by mouth 2 (two) times daily.  30 tablet  1   Penicillins No family history on file. Social History:   reports that he has been smoking Cigarettes.  He has been smoking about 1 pack per day. He does not have any smokeless tobacco history on file. He reports that he  drinks alcohol. He reports that he does not use illicit drugs.   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 He's otherwise been in good health. He's had a previous inguinal hernia repair.  Physical Exam:   Blood pressure 116/72, pulse 74, temperature 98.6 F (37 C), temperature source Temporal, resp. rate 16, height 5\' 10"  (1.778 m), weight 186 lb 8 oz (84.596 kg). Body mass index is 26.76 kg/(m^2).  Gen:  WDWN white male NAD  Neurological: Alert and oriented to person, place, and time. Motor and sensory function is grossly intact  Head: Normocephalic and atraumatic.  Eyes: Conjunctivae are normal. Pupils are equal, round, and reactive to light. No scleral icterus.  Neck: Normal range of motion. Neck supple. No tracheal deviation or thyromegaly present.  Cardiovascular:  SR without murmurs or gallops.  No carotid bruits Respiratory: Effort normal.  No respiratory distress. No chest wall tenderness. Breath sounds normal.  No wheezes, rales or rhonchi.  Abdomen: nontender at the present time GU: Musculoskeletal: Normal range of motion. Extremities are nontender. No cyanosis, edema or clubbing noted Lymphadenopathy: No cervical, preauricular, postauricular or axillary adenopathy is present Skin: Skin is warm and dry. No rash noted. No diaphoresis. No erythema. No pallor. Pscyh: Normal mood and affect. Behavior is normal. Judgment and thought content normal.  LABORATORY RESULTS: No results found for this or any previous visit (from the past 48 hour(s)).  RADIOLOGY RESULTS: No results found.  Problem List: There is no problem list on file for this patient.   Assessment & Plan: Possible small stone disease occluding the cystic duct. Patient asymptomatic at present and wants to manage this without surgery. If his symptoms recur he knows to call and we will get with our pleasant schedule her as an get him on the OR docket.      Matt B. Daphine Deutscher, MD, [facility] [phone] beeper [phone]  11/13/2011 3:22 PM

## 2011-12-08 ENCOUNTER — Encounter (HOSPITAL_COMMUNITY): Payer: Self-pay | Admitting: Emergency Medicine

## 2011-12-08 ENCOUNTER — Observation Stay (HOSPITAL_COMMUNITY)
Admission: EM | Admit: 2011-12-08 | Discharge: 2011-12-10 | DRG: 494 | Disposition: A | Discharge status: Home / Self Care | Payer: BC Managed Care – PPO | Attending: General Surgery | Admitting: General Surgery

## 2011-12-08 DIAGNOSIS — R1013 Epigastric pain: Secondary | ICD-10-CM | POA: Insufficient documentation

## 2011-12-08 DIAGNOSIS — L559 Sunburn, unspecified: Secondary | ICD-10-CM | POA: Insufficient documentation

## 2011-12-08 DIAGNOSIS — K810 Acute cholecystitis: Secondary | ICD-10-CM

## 2011-12-08 DIAGNOSIS — Z87442 Personal history of urinary calculi: Secondary | ICD-10-CM | POA: Insufficient documentation

## 2011-12-08 DIAGNOSIS — K811 Chronic cholecystitis: Principal | ICD-10-CM | POA: Insufficient documentation

## 2011-12-08 DIAGNOSIS — F17200 Nicotine dependence, unspecified, uncomplicated: Secondary | ICD-10-CM | POA: Insufficient documentation

## 2011-12-08 DIAGNOSIS — R109 Unspecified abdominal pain: Secondary | ICD-10-CM

## 2011-12-08 LAB — URINALYSIS, ROUTINE W REFLEX MICROSCOPIC
Bilirubin Urine: NEGATIVE
Leukocytes, UA: NEGATIVE
Nitrite: NEGATIVE
Specific Gravity, Urine: 1.025 (ref 1.005–1.030)
Urobilinogen, UA: 0.2 mg/dL (ref 0.0–1.0)
pH: 6 (ref 5.0–8.0)

## 2011-12-08 LAB — COMPREHENSIVE METABOLIC PANEL
Albumin: 3.9 g/dL (ref 3.5–5.2)
BUN: 8 mg/dL (ref 6–23)
Calcium: 9.2 mg/dL (ref 8.4–10.5)
Creatinine, Ser: 0.78 mg/dL (ref 0.50–1.35)
GFR calc Af Amer: >90 mL/min (ref >90)
Glucose, Bld: 100 mg/dL — ABNORMAL HIGH (ref 70–99)
Total Protein: 6.3 g/dL (ref 6.0–8.3)

## 2011-12-08 LAB — CBC
Hemoglobin: 14 g/dL (ref 13.0–17.0)
MCH: 30.4 pg (ref 26.0–34.0)
MCHC: 36.3 g/dL — ABNORMAL HIGH (ref 30.0–36.0)
MCV: 83.9 fL (ref 78.0–100.0)
RBC: 4.6 MIL/uL (ref 4.22–5.81)

## 2011-12-08 LAB — DIFFERENTIAL
Basophils Relative: 0 % (ref 0–1)
Eosinophils Absolute: 0.1 10*3/uL (ref 0.0–0.7)
Eosinophils Relative: 1 % (ref 0–5)
Lymphs Abs: 1.8 10*3/uL (ref 0.7–4.0)
Monocytes Relative: 8 % (ref 3–12)
Neutrophils Relative %: 67 % (ref 43–77)

## 2011-12-08 LAB — LIPASE, BLOOD: Lipase: 18 U/L (ref 11–59)

## 2011-12-08 MED ORDER — HYDROMORPHONE HCL PF 1 MG/ML IJ SOLN
1.0000 mg | Freq: Once | INTRAMUSCULAR | Status: AC
  Administered 2011-12-08: 1 mg via INTRAVENOUS
  Filled 2011-12-08: qty 1

## 2011-12-08 MED ORDER — NICOTINE 14 MG/24HR TD PT24
14.0000 mg | MEDICATED_PATCH | Freq: Every day | TRANSDERMAL | Status: DC
  Administered 2011-12-08 – 2011-12-10 (×3): 14 mg via TRANSDERMAL
  Filled 2011-12-08 (×3): qty 1

## 2011-12-08 MED ORDER — SODIUM CHLORIDE 0.9 % IV BOLUS (SEPSIS)
1000.0000 mL | Freq: Once | INTRAVENOUS | Status: AC
  Administered 2011-12-08: 1000 mL via INTRAVENOUS
  Administered 2011-12-08: 25 mL via INTRAVENOUS

## 2011-12-08 MED ORDER — ONDANSETRON HCL 4 MG/2ML IJ SOLN: 4.0000 mg | Freq: Four times a day (QID) | INTRAMUSCULAR | Status: DC | PRN

## 2011-12-08 MED ORDER — ACETAMINOPHEN 650 MG RE SUPP: 650.0000 mg | Freq: Four times a day (QID) | RECTAL | Status: DC | PRN

## 2011-12-08 MED ORDER — CIPROFLOXACIN IN D5W 400 MG/200ML IV SOLN
400.0000 mg | Freq: Two times a day (BID) | INTRAVENOUS | Status: DC
  Administered 2011-12-08 – 2011-12-09 (×3): 400 mg via INTRAVENOUS
  Filled 2011-12-08 (×4): qty 200

## 2011-12-08 MED ORDER — POTASSIUM CHLORIDE IN NACL 40-0.9 MEQ/L-% IV SOLN
INTRAVENOUS | Status: DC
  Administered 2011-12-08 – 2011-12-10 (×7): via INTRAVENOUS
  Filled 2011-12-08 (×9): qty 1000

## 2011-12-08 MED ORDER — POTASSIUM CHLORIDE 10 MEQ/100ML IV SOLN
10.0000 meq | Freq: Once | INTRAVENOUS | Status: AC
  Administered 2011-12-08: 10 meq via INTRAVENOUS
  Filled 2011-12-08: qty 100

## 2011-12-08 MED ORDER — ONDANSETRON HCL 4 MG/2ML IJ SOLN
4.0000 mg | Freq: Once | INTRAMUSCULAR | Status: AC
  Administered 2011-12-08: 4 mg via INTRAVENOUS
  Filled 2011-12-08: qty 2

## 2011-12-08 MED ORDER — ACETAMINOPHEN 325 MG PO TABS: 650.0000 mg | ORAL_TABLET | Freq: Four times a day (QID) | ORAL | Status: DC | PRN

## 2011-12-08 MED ORDER — HYDROMORPHONE HCL PF 1 MG/ML IJ SOLN
0.5000 mg | INTRAMUSCULAR | Status: DC | PRN
  Administered 2011-12-08 – 2011-12-09 (×5): 1 mg via INTRAVENOUS
  Filled 2011-12-08 (×5): qty 1

## 2011-12-08 NOTE — ED Notes (Signed)
Attempted to place five lead cardiac monitor on pt RN at bedside advised not to.

## 2011-12-08 NOTE — ED Provider Notes (Signed)
Medical screening examination/treatment/procedure(s) were conducted as a shared visit with non-physician practitioner(s) and myself.  I personally evaluated the patient during the encounter.  Known GB patholgy;  No acute abd;  Admit to gen surg  Donnetta Hutching, MD 12/08/11 [phone]

## 2011-12-08 NOTE — ED Notes (Signed)
Pt states that gallbladder has been hurting since 0500 this morning. Severe pain that radiates to the back. Pt had pizza last night for dinner and pain was at a 10. Pt states nausea and diarrhea. Pt has had chills.
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
## 2011-12-08 NOTE — Progress Notes (Signed)
Pt states his pcp is [facility] with rotating doctors

## 2011-12-08 NOTE — ED Provider Notes (Signed)
History     CSN: [id number]  Arrival date & time 12/08/11  7829   First MD Initiated Contact with Patient 12/08/11 0715      Chief Complaint  Patient presents with  . Abdominal Pain    (Consider location/radiation/quality/duration/timing/severity/associated sxs/prior treatment) HPI   31 y.o. male presents to ED c/o epigastric abd pain.  Onset 5:00 this a.m. Pain is described as sharp, waxing and waning, radiates from the front to the back associated nausea without vomiting. Last bowel movement this a.m. and was loose, no blood. No appetite. Endorse chills.  Denies fever, cough, shortness of breath, chest pain, urinary symptoms. Admits to eating pizza last night for dinner and think it may have worsen it.  Nothing seems to make pain better.    Pain felt very similar to his prior epigastric abdominal pain which he has for the past several months.  Pt has been evaluated for this recently with [place] and HIDA scan and it was thought to be related to acalculous cholecystitis.  He was instructed to avoid fatty food but to return for laparoscopic cholecystectomy if pain worsen.  Pt is receptive to having surgery as he think this pain is related.  Did admits to drinking alcohol over the weekend at a bachelor party but denies any pain at that time.  Denies rec drug use.  Has hx of R inguinal hernia with surgical repair.  Denies pain at hernia site.       Past Medical History  Diagnosis Date  . Heart murmur   . Kidney stones     Past Surgical History  Procedure Date  . Shoulder surgery   . Hernia repair   . Clavicle surgery     History reviewed. No pertinent family history.  History  Substance Use Topics  . Smoking status: Current Everyday Smoker -- 1.0 packs/day    Types: Cigarettes  . Smokeless tobacco: Not on file  . Alcohol Use: Yes      Review of Systems  All other systems reviewed and are negative.    Allergies  Penicillins  Home Medications   Current Outpatient Rx    Name Route Sig Dispense Refill  . FAMOTIDINE 40 MG PO TABS Oral Take 1 tablet (40 mg total) by mouth daily. 30 tablet 0  . HYDROCODONE-ACETAMINOPHEN 5-325 MG PO TABS Oral Take 1 tablet by mouth every 6 (six) hours as needed.    Marland Kitchen OMEPRAZOLE 20 MG PO CPDR Oral Take 1 capsule (20 mg total) by mouth daily. 30 capsule 0  . OXYCODONE-ACETAMINOPHEN 5-325 MG PO TABS Oral Take 1 tablet by mouth every 4 (four) hours as needed.    . SUCRALFATE 1 G PO TABS Oral Take 1 tablet (1 g total) by mouth 2 (two) times daily. 30 tablet 1    BP 135/90  Pulse 60  Temp 98.4 F (36.9 C) (Oral)  Resp 18  SpO2 96%  Physical Exam  Nursing note and vitals reviewed. Constitutional: He appears well-developed and well-nourished. No distress.       Awake, alert, nontoxic appearance  HENT:  Head: Atraumatic.  Eyes: Conjunctivae are normal. Right eye exhibits no discharge. Left eye exhibits no discharge.  Neck: Normal range of motion. Neck supple.  Cardiovascular: Normal rate and regular rhythm.   Pulmonary/Chest: Effort normal. No respiratory distress. He exhibits no tenderness.  Abdominal: Soft. There is tenderness in the right upper quadrant and epigastric area. There is guarding and positive Murphy's sign. There is no rigidity, no rebound  and no CVA tenderness. No hernia.  Musculoskeletal: He exhibits no tenderness.       ROM appears intact, no obvious focal weakness  Neurological: He is alert.  Skin: Skin is warm and dry. No rash noted.  Psychiatric: He has a normal mood and affect.    ED Course  Procedures (including critical care time)   Labs Reviewed  CBC  DIFFERENTIAL  COMPREHENSIVE METABOLIC PANEL   No results found.   No diagnosis found.  Results for orders placed during the hospital encounter of 12/08/11  CBC      Component Value Range   WBC 7.3  4.0 - 10.5 K/uL   RBC 4.60  4.22 - 5.81 MIL/uL   Hemoglobin 14.0  13.0 - 17.0 g/dL   HCT 16.1 (*) 09.6 - 04.5 %   MCV 83.9  78.0 - 100.0 fL    MCH 30.4  26.0 - 34.0 pg   MCHC 36.3 (*) 30.0 - 36.0 g/dL   RDW 40.9  81.1 - 91.4 %   Platelets 139 (*) 150 - 400 K/uL  DIFFERENTIAL      Component Value Range   Neutrophils Relative 67  43 - 77 %   Neutro Abs 4.9  1.7 - 7.7 K/uL   Lymphocytes Relative 24  12 - 46 %   Lymphs Abs 1.8  0.7 - 4.0 K/uL   Monocytes Relative 8  3 - 12 %   Monocytes Absolute 0.6  0.1 - 1.0 K/uL   Eosinophils Relative 1  0 - 5 %   Eosinophils Absolute 0.1  0.0 - 0.7 K/uL   Basophils Relative 0  0 - 1 %   Basophils Absolute 0.0  0.0 - 0.1 K/uL  COMPREHENSIVE METABOLIC PANEL      Component Value Range   Sodium 137  135 - 145 mEq/L   Potassium 3.3 (*) 3.5 - 5.1 mEq/L   Chloride 102  96 - 112 mEq/L   CO2 26  19 - 32 mEq/L   Glucose, Bld 100 (*) 70 - 99 mg/dL   BUN 8  6 - 23 mg/dL   Creatinine, Ser 7.82  0.50 - 1.35 mg/dL   Calcium 9.2  8.4 - 95.6 mg/dL   Total Protein 6.3  6.0 - 8.3 g/dL   Albumin 3.9  3.5 - 5.2 g/dL   AST 19  0 - 37 U/L   ALT 20  0 - 53 U/L   Alkaline Phosphatase 59  39 - 117 U/L   Total Bilirubin 0.5  0.3 - 1.2 mg/dL   GFR calc non Af Amer >90  >90 mL/min   GFR calc Af Amer >90  >90 mL/min  LIPASE, BLOOD      Component Value Range   Lipase 18  11 - 59 U/L  URINALYSIS, ROUTINE W REFLEX MICROSCOPIC      Component Value Range   Color, Urine YELLOW  YELLOW   APPearance CLEAR  CLEAR   Specific Gravity, Urine 1.025  1.005 - 1.030   pH 6.0  5.0 - 8.0   Glucose, UA NEGATIVE  NEGATIVE mg/dL   Hgb urine dipstick NEGATIVE  NEGATIVE   Bilirubin Urine NEGATIVE  NEGATIVE   Ketones, ur NEGATIVE  NEGATIVE mg/dL   Protein, ur NEGATIVE  NEGATIVE mg/dL   Urobilinogen, UA 0.2  0.0 - 1.0 mg/dL   Nitrite NEGATIVE  NEGATIVE   Leukocytes, UA NEGATIVE  NEGATIVE   No results found.  1. Abdominal pain  MDM  Epigastric abd  pain, recurrent.  LIkely related to acalculous cholecystitis.  Will obtain labs, IVF hydration, pain management, and will consult surgery.  Pt made NPO.  Currently VSS.   Discussed care with my attending.     8:33 AM Request more pain medication after receiving dilaudid 1mg  IV.  Pt otherwise stable.    9:29 AM Pain has improved.  Work up unremarkable.  I spoke with [facility]CS who agrees to see pt in ED and will admit pt.  Last oral intake was some Dr. Reino Kent at 5am this morning.   Nursing notes reviewed and considered in documentation  Previous records reviewed and considered  All labs/vitals reviewed and considered  xrays reviewed and considered      Fayrene Helper, PA-C 12/08/11 0958     Fayrene Helper, PA-C 12/08/11 1338

## 2011-12-08 NOTE — ED Notes (Signed)
Placed yellow bracelet and red socks on pt.

## 2011-12-08 NOTE — Anesthesia Preprocedure Evaluation (Addendum)
Anesthesia Evaluation  Patient identified by MRN, date of birth, ID band Patient awake    Reviewed: Allergy & Precautions, H&P , NPO status , Patient's Chart, lab work & pertinent test results, reviewed documented beta blocker date and time   Airway Mallampati: II TM Distance: >3 FB Neck ROM: Full    Dental  (+) Upper Dentures and Dental Advisory Given   Pulmonary Current Smoker,  breath sounds clear to auscultation  Pulmonary exam normal       Cardiovascular negative cardio ROS  Rate:Normal  Denies cardiac symptoms   Neuro/Psych negative neurological ROS  negative psych ROS   GI/Hepatic Neg liver ROS, cholecystitis   Endo/Other  negative endocrine ROS  Renal/GU negative Renal ROS  negative genitourinary   Musculoskeletal negative musculoskeletal ROS (+)   Abdominal   Peds negative pediatric ROS (+)  Hematology negative hematology ROS (+)   Anesthesia Other Findings   Reproductive/Obstetrics negative OB ROS                          Anesthesia Physical Anesthesia Plan  ASA: II  Anesthesia Plan: General   Post-op Pain Management:    Induction: Intravenous  Airway Management Planned: Oral ETT  Additional Equipment:   Intra-op Plan:   Post-operative Plan: Extubation in OR  Informed Consent: I have reviewed the patients History and Physical, chart, labs and discussed the procedure including the risks, benefits and alternatives for the proposed anesthesia with the patient or authorized representative who has indicated his/her understanding and acceptance.   Dental advisory given  Plan Discussed with: CRNA and Surgeon  Anesthesia Plan Comments:         Anesthesia Quick Evaluation

## 2011-12-08 NOTE — H&P (Signed)
Pt seen and examined earlier this evening around 7pm  History as below  No CP, SOB, DOE, PND, orthopnea, TIA, jaundice, acholic stools, melena, hematochezia  Vitals reviewed Alert, NAD, resting comfortably CTA RRR Soft, nd, mild RUQ TTP, no RT, no guarding  Dr Ermalene Searing note reviewed U/s & HIDA reviewed Labs reviewed  Acalculous cholecystitis Tobacco abuse  Failed medical management of GB disease.  I believe the patient's symptoms are consistent with gallbladder disease.  We discussed gallbladder disease. We discussed non-operative and operative management. We discussed the signs & symptoms of acute cholecystitis  I discussed laparoscopic cholecystectomy in detail.  The patient was shown diagrams detailing the procedure.  We discussed the risks and benefits of a laparoscopic cholecystectomy including, but not limited to bleeding, infection, injury to surrounding structures such as the intestine or liver, bile leak, retained gallstones, need to convert to an open procedure, prolonged diarrhea, blood clots such as  DVT, common bile duct injury, anesthesia risks, and possible need for additional procedures.  We discussed the typical post-operative recovery course. I explained that the likelihood of improvement of their symptoms is good.  Plan OR Wed pm.  Pt elected to proceed to OR  Mary Sella. Andrey Campanile, MD, FACS General, Bariatric, & Minimally Invasive Surgery [facility], [place]

## 2011-12-09 ENCOUNTER — Inpatient Hospital Stay (HOSPITAL_COMMUNITY): Payer: BC Managed Care – PPO | Admitting: Anesthesiology

## 2011-12-09 ENCOUNTER — Encounter (HOSPITAL_COMMUNITY): Payer: Self-pay | Admitting: Anesthesiology

## 2011-12-09 ENCOUNTER — Encounter (HOSPITAL_COMMUNITY): Admission: EM | Disposition: A | Discharge status: Home / Self Care | Payer: Self-pay | Source: Home / Self Care

## 2011-12-09 HISTORY — PX: CHOLECYSTECTOMY: SHX55

## 2011-12-09 LAB — SURGICAL PCR SCREEN
MRSA, PCR: NEGATIVE
Staphylococcus aureus: NEGATIVE

## 2011-12-09 SURGERY — LAPAROSCOPIC CHOLECYSTECTOMY
Anesthesia: General | Site: Abdomen | Wound class: Clean Contaminated

## 2011-12-09 MED ORDER — LACTATED RINGERS IV SOLN
INTRAVENOUS | Status: DC
  Administered 2011-12-09: 19:00:00 via INTRAVENOUS

## 2011-12-09 MED ORDER — BUPIVACAINE-EPINEPHRINE 0.25% -1:200000 IJ SOLN
INTRAMUSCULAR | Status: DC | PRN
  Administered 2011-12-09: 20 mL

## 2011-12-09 MED ORDER — SUCCINYLCHOLINE CHLORIDE 20 MG/ML IJ SOLN
INTRAMUSCULAR | Status: DC | PRN
  Administered 2011-12-09: 100 mg via INTRAVENOUS

## 2011-12-09 MED ORDER — OXYCODONE-ACETAMINOPHEN 5-325 MG PO TABS
1.0000 | ORAL_TABLET | ORAL | Status: DC | PRN
  Administered 2011-12-09 – 2011-12-10 (×3): 2 via ORAL
  Filled 2011-12-09 (×3): qty 2

## 2011-12-09 MED ORDER — FENTANYL CITRATE 0.05 MG/ML IJ SOLN: 25.0000 ug | INTRAMUSCULAR | Status: DC | PRN

## 2011-12-09 MED ORDER — DEXAMETHASONE SODIUM PHOSPHATE 10 MG/ML IJ SOLN
INTRAMUSCULAR | Status: DC | PRN
  Administered 2011-12-09: 10 mg via INTRAVENOUS

## 2011-12-09 MED ORDER — MEPERIDINE HCL 50 MG/ML IJ SOLN
6.2500 mg | INTRAMUSCULAR | Status: DC | PRN
  Administered 2011-12-09: 12.5 mg via INTRAVENOUS

## 2011-12-09 MED ORDER — ENOXAPARIN SODIUM 40 MG/0.4ML ~~LOC~~ SOLN
40.0000 mg | SUBCUTANEOUS | Status: DC
  Administered 2011-12-10: 40 mg via SUBCUTANEOUS
  Filled 2011-12-09 (×2): qty 0.4

## 2011-12-09 MED ORDER — FENTANYL CITRATE 0.05 MG/ML IJ SOLN
INTRAMUSCULAR | Status: DC | PRN
  Administered 2011-12-09 (×5): 50 ug via INTRAVENOUS

## 2011-12-09 MED ORDER — MEPERIDINE HCL 50 MG/ML IJ SOLN
12.5000 mg | Freq: Once | INTRAMUSCULAR | Status: AC
  Administered 2011-12-09: 12.5 mg via INTRAVENOUS

## 2011-12-09 MED ORDER — HYDROMORPHONE HCL PF 1 MG/ML IJ SOLN
1.0000 mg | INTRAMUSCULAR | Status: DC | PRN
  Administered 2011-12-09: 1 mg via INTRAVENOUS
  Filled 2011-12-09: qty 1

## 2011-12-09 MED ORDER — HYDROMORPHONE HCL PF 1 MG/ML IJ SOLN
0.2500 mg | INTRAMUSCULAR | Status: DC | PRN
  Administered 2011-12-09: 0.5 mg via INTRAVENOUS

## 2011-12-09 MED ORDER — LACTATED RINGERS IV SOLN
INTRAVENOUS | Status: DC | PRN
  Administered 2011-12-09: 3000 mL via INTRAVENOUS

## 2011-12-09 MED ORDER — GLYCOPYRROLATE 0.2 MG/ML IJ SOLN
INTRAMUSCULAR | Status: DC | PRN
  Administered 2011-12-09: 0.2 mg via INTRAVENOUS
  Administered 2011-12-09: 0.6 mg via INTRAVENOUS

## 2011-12-09 MED ORDER — MIDAZOLAM HCL 5 MG/5ML IJ SOLN
INTRAMUSCULAR | Status: DC | PRN
  Administered 2011-12-09: 2 mg via INTRAVENOUS

## 2011-12-09 MED ORDER — LACTATED RINGERS IV SOLN
INTRAVENOUS | Status: DC
  Administered 2011-12-09: 1000 mL via INTRAVENOUS
  Administered 2011-12-09: 17:00:00 via INTRAVENOUS

## 2011-12-09 MED ORDER — ROCURONIUM BROMIDE 100 MG/10ML IV SOLN
INTRAVENOUS | Status: DC | PRN
  Administered 2011-12-09: 40 mg via INTRAVENOUS

## 2011-12-09 MED ORDER — 0.9 % SODIUM CHLORIDE (POUR BTL) OPTIME
TOPICAL | Status: DC | PRN
  Administered 2011-12-09: 1000 mL

## 2011-12-09 MED ORDER — ONDANSETRON HCL 4 MG/2ML IJ SOLN
INTRAMUSCULAR | Status: DC | PRN
  Administered 2011-12-09: 4 mg via INTRAVENOUS

## 2011-12-09 MED ORDER — PROPOFOL 10 MG/ML IV BOLUS
INTRAVENOUS | Status: DC | PRN
  Administered 2011-12-09: 160 mg via INTRAVENOUS

## 2011-12-09 MED ORDER — LIDOCAINE HCL (CARDIAC) 20 MG/ML IV SOLN
INTRAVENOUS | Status: DC | PRN
  Administered 2011-12-09: 50 mg via INTRAVENOUS

## 2011-12-09 MED ORDER — NEOSTIGMINE METHYLSULFATE 1 MG/ML IJ SOLN
INTRAMUSCULAR | Status: DC | PRN
  Administered 2011-12-09: 4 mg via INTRAVENOUS

## 2011-12-09 SURGICAL SUPPLY — 48 items
ADH SKN CLS APL DERMABOND .7 (GAUZE/BANDAGES/DRESSINGS) ×4
APL SKNCLS STERI-STRIP NONHPOA (GAUZE/BANDAGES/DRESSINGS) ×2
APPLIER CLIP 5 13 M/L LIGAMAX5 (MISCELLANEOUS) ×3
APPLIER CLIP ROT 10 11.4 M/L (STAPLE)
APR CLP MED LRG 11.4X10 (STAPLE)
APR CLP MED LRG 5 ANG JAW (MISCELLANEOUS) ×2
BAG SPEC RTRVL LRG 6X4 10 (ENDOMECHANICALS) ×2
BANDAGE ADHESIVE 1X3 (GAUZE/BANDAGES/DRESSINGS) ×9 IMPLANT
BENZOIN TINCTURE PRP APPL 2/3 (GAUZE/BANDAGES/DRESSINGS) ×3 IMPLANT
CANISTER SUCTION 2500CC (MISCELLANEOUS) ×3 IMPLANT
CHLORAPREP W/TINT 26ML (MISCELLANEOUS) ×3 IMPLANT
CLIP APPLIE 5 13 M/L LIGAMAX5 (MISCELLANEOUS) ×1 IMPLANT
CLIP APPLIE ROT 10 11.4 M/L (STAPLE) IMPLANT
CLOTH BEACON ORANGE TIMEOUT ST (SAFETY) ×3 IMPLANT
COVER MAYO STAND STRL (DRAPES) IMPLANT
COVER SURGICAL LIGHT HANDLE (MISCELLANEOUS) ×3 IMPLANT
DECANTER SPIKE VIAL GLASS SM (MISCELLANEOUS) ×3 IMPLANT
DERMABOND ADVANCED (GAUZE/BANDAGES/DRESSINGS) ×2
DERMABOND ADVANCED .7 DNX12 (GAUZE/BANDAGES/DRESSINGS) ×2 IMPLANT
DRAPE C-ARM 42X72 X-RAY (DRAPES) IMPLANT
DRAPE LAPAROSCOPIC ABDOMINAL (DRAPES) ×3 IMPLANT
DRAPE UTILITY XL STRL (DRAPES) ×3 IMPLANT
DRSG TEGADERM 2-3/8X2-3/4 SM (GAUZE/BANDAGES/DRESSINGS) ×3 IMPLANT
ELECT REM PT RETURN 9FT ADLT (ELECTROSURGICAL) ×3
ELECTRODE REM PT RTRN 9FT ADLT (ELECTROSURGICAL) ×2 IMPLANT
GLOVE BIO SURGEON STRL SZ7.5 (GLOVE) ×3 IMPLANT
GLOVE BIOGEL M STRL SZ7.5 (GLOVE) IMPLANT
GLOVE BIOGEL PI IND STRL 7.0 (GLOVE) ×3 IMPLANT
GLOVE BIOGEL PI INDICATOR 7.0 (GLOVE) ×2
GLOVE INDICATOR 8.0 STRL GRN (GLOVE) ×3 IMPLANT
GLOVE SURG SS PI 6.5 STRL IVOR (GLOVE) ×2 IMPLANT
GLOVE SURG SS PI 7.0 STRL IVOR (GLOVE) ×2 IMPLANT
GOWN STRL NON-REIN LRG LVL3 (GOWN DISPOSABLE) ×5 IMPLANT
GOWN STRL REIN XL XLG (GOWN DISPOSABLE) ×6 IMPLANT
KIT BASIN OR (CUSTOM PROCEDURE TRAY) ×3 IMPLANT
NS IRRIG 1000ML POUR BTL (IV SOLUTION) ×3 IMPLANT
POUCH SPECIMEN RETRIEVAL 10MM (ENDOMECHANICALS) ×3 IMPLANT
SET CHOLANGIOGRAPH MIX (MISCELLANEOUS) IMPLANT
SET IRRIG TUBING LAPAROSCOPIC (IRRIGATION / IRRIGATOR) ×3 IMPLANT
SOLUTION ANTI FOG 6CC (MISCELLANEOUS) ×3 IMPLANT
STRIP CLOSURE SKIN 1/2X4 (GAUZE/BANDAGES/DRESSINGS) ×3 IMPLANT
SUT MNCRL AB 4-0 PS2 18 (SUTURE) ×3 IMPLANT
SUT VICRYL 0 UR6 27IN ABS (SUTURE) ×2 IMPLANT
TOWEL OR 17X26 10 PK STRL BLUE (TOWEL DISPOSABLE) ×3 IMPLANT
TRAY LAP CHOLE (CUSTOM PROCEDURE TRAY) ×3 IMPLANT
TROCAR BLADELESS OPT 5 75 (ENDOMECHANICALS) ×9 IMPLANT
TROCAR XCEL BLUNT TIP 100MML (ENDOMECHANICALS) ×3 IMPLANT
TUBING INSUFFLATION 10FT LAP (TUBING) ×3 IMPLANT

## 2011-12-09 NOTE — Progress Notes (Signed)
Day of Surgery  Subjective: Npo except for clears, no pain.  Objective: Vital signs in last 24 hours: Temp:  [97.8 F (36.6 C)-98.3 F (36.8 C)] 98.3 F (36.8 C) (07/24 0535) Pulse Rate:  [52-58] 52  (07/24 0535) Resp:  [14-16] 16  (07/24 0535) BP: (105-132)/(61-83) 105/67 mmHg (07/24 0535) SpO2:  [95 %-96 %] 96 % (07/24 0535) Weight:  [84.823 kg (187 lb)] 84.823 kg (187 lb) (07/23 1427) Last BM Date: 12/07/11  Intake/Output from previous day: 07/23 0701 - 07/24 0700 In: 1460 [P.O.:360; I.V.:1100] Out: -  Intake/Output this shift:    GI: soft, non-tender; bowel sounds normal; no masses,  no organomegaly  Lab Results:   [facility] 12/08/11 0750  WBC 7.3  HGB 14.0  HCT 38.6*  PLT 139*    BMET  Basename 12/08/11 0750  NA 137  K 3.3*  CL 102  CO2 26  GLUCOSE 100*  BUN 8  CREATININE 0.78  CALCIUM 9.2   PT/INR No results found for this basename: LABPROT:2,INR:2 in the last 72 hours   Lab 12/08/11 0750  AST 19  ALT 20  ALKPHOS 59  BILITOT 0.5  PROT 6.3  ALBUMIN 3.9     Lipase     Component Value Date/Time   LIPASE 18 12/08/2011 0750     Studies/Results: No results found.  Medications:    . ciprofloxacin  400 mg Intravenous Q12H  . nicotine  14 mg Transdermal Daily  . potassium chloride  10 mEq Intravenous Once    Assessment/Plan 1.Chronic cholecystits  2.tobacco use   Plan:  Surgery later today.    LOS: 1 day    Nana Vastine 12/09/2011

## 2011-12-09 NOTE — Transfer of Care (Signed)
Immediate Anesthesia Transfer of Care Note  Patient: Matthew Garcia  Procedure(s) Performed: Procedure(s) (LRB): LAPAROSCOPIC CHOLECYSTECTOMY (N/A)  Patient Location: PACU  Anesthesia Type: General  Level of Consciousness: sedated  Airway & Oxygen Therapy: Patient Spontanous Breathing and Patient connected to face mask oxygen  Post-op Assessment: Report given to PACU RN and Post -op Vital signs reviewed and stable  Post vital signs: Reviewed and stable  Complications: No apparent anesthesia complications

## 2011-12-09 NOTE — Op Note (Signed)
Laparoscopic Cholecystectomy Procedure Note  Indications: This patient presents with symptomatic gallbladder disease and will undergo laparoscopic cholecystectomy.  Pre-operative Diagnosis: Acalculous cholecystitis  Post-operative Diagnosis: Same  Surgeon: Atilano Ina   Assistants: none  Anesthesia: General endotracheal anesthesia  ASA Class: 2  Procedure Details  The patient was seen again in the Holding Room. The risks, benefits, complications, treatment options, and expected outcomes were discussed with the patient. The possibilities of reaction to medication, pulmonary aspiration, perforation of viscus, bleeding, recurrent infection, finding a normal gallbladder, the need for additional procedures, failure to diagnose a condition, the possible need to convert to an open procedure, and creating a complication requiring transfusion or operation were discussed with the patient. The likelihood of improving the patient's symptoms with return to their baseline status is good.  The patient and/or family concurred with the proposed plan, giving informed consent. The site of surgery properly noted. The patient was taken to Operating Room, identified as AURON TADROS and the procedure verified as Laparoscopic Cholecystectomy. A Time Out was held and the above information confirmed.  Prior to the induction of general anesthesia, antibiotic prophylaxis was administered. General endotracheal anesthesia was then administered and tolerated well. After the induction, the abdomen was prepped with Chloraprep and draped in the sterile fashion. The patient was positioned in the supine position.  Local anesthetic agent was injected into the skin near the umbilicus and an incision made. We dissected down to the abdominal fascia with blunt dissection.  The fascia was incised vertically and we entered the peritoneal cavity bluntly.  A pursestring suture of 0-Vicryl was placed around the fascial opening.  The  Hasson cannula was inserted and secured with the stay suture.  Pneumoperitoneum was then created with CO2 and tolerated well without any adverse changes in the patient's vital signs. An 5-mm port was placed in the subxiphoid position.  Two 5-mm ports were placed in the right upper quadrant. All skin incisions were infiltrated with a local anesthetic agent before making the incision and placing the trocars.   We positioned the patient in reverse Trendelenburg, tilted slightly to the patient's left.  The gallbladder was identified, the fundus grasped and retracted cephalad. Adhesions were lysed bluntly and with the electrocautery where indicated, taking care not to injure any adjacent organs or viscus. The infundibulum was grasped and retracted laterally, exposing the peritoneum overlying the triangle of Calot. There was some edema in the gallbladder wall.  This was then divided and exposed in a blunt fashion. A critical view of the cystic duct and cystic artery was obtained.  The cystic duct was clearly identified and bluntly dissected circumferentially. The cystic duct was ligated with a clip distally.   The cystic duct was then ligated with clips and divided. The cystic artery was identified, dissected free, ligated with clips and divided as well. There was a high posterior branch of the cystic artery that was ligated with clips as well.   The gallbladder was dissected from the liver bed in retrograde fashion with the electrocautery. The gallbladder was removed and placed in an Endocatch sac.  The gallbladder and Endocatch sac were then removed through the umbilical port site. The liver bed was irrigated and inspected. Hemostasis was achieved with the electrocautery. Copious irrigation was utilized and was repeatedly aspirated until clear.  The pursestring suture was used to close the umbilical fascia.    We again inspected the right upper quadrant for hemostasis.  The umbilical closure was inspected and  there was  no air leak and nothing trapped within the closure. Pneumoperitoneum was released as we removed the trocars.  4-0 Monocryl was used to close the skin.  Dermabond was applied. The patient was then extubated and brought to the recovery room in stable condition. Instrument, sponge, and needle counts were correct at closure and at the conclusion of the case.   Findings: Cholecystitis without Cholelithiasis  Estimated Blood Loss: Minimal         Drains: none         Specimens: Gallbladder           Complications: None; patient tolerated the procedure well.         Disposition: PACU - hemodynamically stable.         Condition: stable  Mary Sella. Andrey Campanile, MD, FACS General, Bariatric, & Minimally Invasive Surgery [facility], [place]

## 2011-12-09 NOTE — Anesthesia Postprocedure Evaluation (Signed)
  Anesthesia Post-op Note  Patient: Matthew Garcia  Procedure(s) Performed: Procedure(s) (LRB): LAPAROSCOPIC CHOLECYSTECTOMY (N/A)  Patient Location: PACU  Anesthesia Type: General  Level of Consciousness: awake and alert   Airway and Oxygen Therapy: Patient Spontanous Breathing  Post-op Pain: mild  Post-op Assessment: Post-op Vital signs reviewed, Patient's Cardiovascular Status Stable, Respiratory Function Stable, Patent Airway and No signs of Nausea or vomiting  Post-op Vital Signs: stable  Complications: No apparent anesthesia complications

## 2011-12-09 NOTE — Progress Notes (Signed)
Pt seen in holding.  All questions asked and answered.   To or for Lap Chole  Mary Sella. Andrey Campanile, MD, FACS General, Bariatric, & Minimally Invasive Surgery [facility], [place]

## 2011-12-09 NOTE — Anesthesia Procedure Notes (Signed)
Procedure Name: Intubation Date/Time: 12/09/2011 4:30 PM Performed by: Doran Clay Pre-anesthesia Checklist: Patient identified, Timeout performed, Emergency Drugs available, Suction available and Patient being monitored Patient Re-evaluated:Patient Re-evaluated prior to inductionOxygen Delivery Method: Circle system utilized Preoxygenation: Pre-oxygenation with 100% oxygen Intubation Type: IV induction Ventilation: Mask ventilation without difficulty Laryngoscope Size: Mac and 4 Grade View: Grade I Tube type: Oral Tube size: 7.5 mm Number of attempts: 1 Airway Equipment and Method: Stylet Placement Confirmation: ETT inserted through vocal cords under direct vision,  breath sounds checked- equal and bilateral and positive ETCO2 Secured at: 22 cm Tube secured with: Tape Dental Injury: Teeth and Oropharynx as per pre-operative assessment

## 2011-12-10 ENCOUNTER — Telehealth (INDEPENDENT_AMBULATORY_CARE_PROVIDER_SITE_OTHER): Payer: Self-pay | Admitting: General Surgery

## 2011-12-10 ENCOUNTER — Encounter (HOSPITAL_COMMUNITY): Payer: Self-pay | Admitting: General Surgery

## 2011-12-10 MED ORDER — OXYCODONE-ACETAMINOPHEN 5-325 MG PO TABS: 1.0000 | ORAL_TABLET | ORAL | Status: AC | PRN

## 2011-12-10 NOTE — Telephone Encounter (Signed)
Message copied by Liliana Cline on Thu Dec 10, 2011  3:48 PM ------      Message from: Erin Sons      Created: Thu Dec 10, 2011  3:32 PM      Regarding: 1st PO GB Dr Nelva Nay      Contact: [phone]       Pt need PO appt. Please call pt first b/f you schedule appt b/c he has days he can't come b/c he is getting married in 3 wks.            Thanks

## 2011-12-10 NOTE — Progress Notes (Signed)
Patient to be discharged to home, received and understood prescriptions and discharge instructions, will be discharged in wheelchair.

## 2011-12-10 NOTE — Discharge Summary (Signed)
Physician Discharge Summary  Patient ID: Matthew Garcia MRN: [id number] DOB/AGE: 11-20-1980 31 y.o.  Admit date: 12/08/2011 Discharge date: 12/10/2011  Admission Diagnoses: Acalculous cholecystitis   Discharge Diagnoses: s/p laproscopic cholecystectomy Active Problems:  * No active hospital problems. *    Discharged Condition: stable  Hospital Course:Patient is a 31 year old male who was referred by Dr. Evette Cristal to Dr. Luretha Murphy on 11/13/2011. He has a history of abdominal pain after eating fried chicken wings. Having several months of epigastric pain off and at night. He had a gallbladder ultrasound which was normal for except showing nonobstructing kidney stone. This is been present for several years. He had a HIDA scan which was consistent with obstruction the cystic duct. The gallbladder never visualized. It was recommended he undergo laparoscopic cholecystectomy at that time. Patient deferred, he wanted to try and be treated medically. He's done well until 5 AM on the morning of his presentation to the ed at [facility]. He reported that he had pizza the previous evening for supper around 7 or 8 PM. He awoke up this with abdominal pain below the xiphoid going across his abdomen on both sides and into his back. He also had nausea which was new. he presented to the emergency room with the afore mentioned c/o and we were asked to see him for his chronic cholecystitis.  After examination and consultation with Dr. Andrey Campanile he consented to undergo a lap chole, which he has had done. He has continued to progress well postoperatively, remaining hemodynamically stable and afebrile as well as tolerating a regular diet. For these reasons he is deemed stable for discharge to home/self care with f/u with Dr. Andrey Campanile in 2-3 weeks time.  Consults: None  Significant Diagnostic Studies: labs  Treatments: IV hydration, antibiotics, analgesia and surgery.  Discharge Exam: Blood pressure 129/77, pulse 70,  temperature 98.7 F (37.1 C), temperature source Oral, resp. rate 18, height 5\' 8"  (1.727 m), weight 187 lb (84.823 kg), SpO2 98.00%. General appearance: alert, cooperative, appears stated age and no distress Chest: CTA bilaterally Abdomen: approriatly tender, + BS,flatus, no BM yet. No bloating or erythema, drainage. All surgical wounds appear to be healing well.  Disposition: 01-Home or Self Care   Medication List  As of 12/10/2011  1:41 PM   ASK your doctor about these medications         HYDROcodone-acetaminophen 5-325 MG per tablet   Commonly known as: NORCO/VICODIN   Take 1 tablet by mouth every 6 (six) hours as needed. pain      ibuprofen 200 MG tablet   Commonly known as: ADVIL,MOTRIN   Take 800 mg by mouth every 6 (six) hours as needed. pain           Follow-up Information    Follow up with Atilano Ina, MD,FACS. Schedule an appointment as soon as possible for a visit in 2 weeks. (Make an appointment 2-3 weeks.  Sooner if needed.)    Contact information:   [employer] [place] [address], [address] [place] [address] [phone]          Signed: Blenda Mounts 12/10/2011, 1:41 PM

## 2011-12-10 NOTE — Progress Notes (Signed)
1 Day Post-Op  Subjective: Up walking in hallway with family member, tolerated diet earlier, no N/V, minimal pain.  Objective: Vital signs in last 24 hours: Temp:  [97.5 F (36.4 C)-98.8 F (37.1 C)] 98.7 F (37.1 C) (07/25 0930) Pulse Rate:  [57-95] 70  (07/25 0930) Resp:  [15-21] 18  (07/25 0930) BP: (121-151)/(65-86) 129/77 mmHg (07/25 0930) SpO2:  [97 %-100 %] 98 % (07/25 0930) Last BM Date: 12/07/11  Intake/Output from previous day: 07/24 0701 - 07/25 0700 In: 6006.3 [P.O.:850; I.V.:5156.3] Out: -  Intake/Output this shift:    General appearance: alert, cooperative, appears stated age and no distress Chest: CTA Abdomen: appropriately tender, all surgical wound sites are well approximated, w/o erythema or drainage., no bloating, + BS and flatus. VSS, afebrile.  Lab Results:   [facility] 12/08/11 0750  WBC 7.3  HGB 14.0  HCT 38.6*  PLT 139*   BMET  Basename 12/08/11 0750  NA 137  K 3.3*  CL 102  CO2 26  GLUCOSE 100*  BUN 8  CREATININE 0.78  CALCIUM 9.2   PT/INR No results found for this basename: LABPROT:2,INR:2 in the last 72 hours ABG No results found for this basename: PHART:2,PCO2:2,PO2:2,HCO3:2 in the last 72 hours  Studies/Results: No results found.  Anti-infectives: Anti-infectives     Start     Dose/Rate Route Frequency Ordered Stop   12/08/11 1030   ciprofloxacin (CIPRO) IVPB 400 mg  Status:  Discontinued        400 mg 200 mL/hr over 60 Minutes Intravenous Every 12 hours 12/08/11 1025 12/09/11 1852          Assessment/Plan: s/p Procedure(s) (LRB): LAPAROSCOPIC CHOLECYSTECTOMY (N/A) 1. Advance diet 2. Probable discharge to home/self care today if he remains w/o N/V after lunch. 3. Follow-up with Dr. Andrey Campanile in 2 weeks time post discharge at our office.   LOS: 2 days    Roshell Brigham 12/10/2011

## 2011-12-10 NOTE — Telephone Encounter (Signed)
Appt made 12/22/11 with patient's fiance.

## 2011-12-15 NOTE — Discharge Summary (Signed)
Matthew Nakajima M. Khianna Blazina, MD, FACS General, Bariatric, & Minimally Invasive Surgery [facility] [place]  

## 2011-12-22 ENCOUNTER — Encounter (INDEPENDENT_AMBULATORY_CARE_PROVIDER_SITE_OTHER): Payer: Self-pay | Admitting: General Surgery

## 2011-12-22 ENCOUNTER — Ambulatory Visit (INDEPENDENT_AMBULATORY_CARE_PROVIDER_SITE_OTHER): Payer: BC Managed Care – PPO | Admitting: General Surgery

## 2011-12-22 VITALS — BP 124/80 | HR 64 | Temp 98.9°F | Resp 16 | Ht 70.0 in | Wt 180.4 lb

## 2011-12-22 DIAGNOSIS — Z09 Encounter for follow-up examination after completed treatment for conditions other than malignant neoplasm: Secondary | ICD-10-CM

## 2011-12-22 NOTE — Progress Notes (Signed)
Subjective:     Patient ID: Matthew Garcia, male   DOB: December 25, 1980, 31 y.o.   MRN: [id number]  HPI 31 year old male comes in for followup after undergoing laparoscopic cholecystectomy for chronic cholecystitis on July 24. He was admitted to the hospital on July 23 and discharged home on July 25. He has no complaints. He has already returned to work. He denies any fever, chills, nausea, vomiting, diarrhea, constipation or abdominal pain  Review of Systems     Objective:   Physical Exam BP 124/80  Pulse 64  Temp 98.9 F (37.2 C)  Resp 16  Ht 5\' 10"  (1.778 m)  Wt 180 lb 6.4 oz (81.829 kg)  BMI 25.88 kg/m2  Gen: alert, NAD, non-toxic appearing Abd: soft, nontender, nondistended. Well-healed trocar sites. No cellulitis. No incisional hernia Ext: no edema, no calf tenderness Skin: no rash, no jaundice     Assessment:     Status post laparoscopic cholecystectomy for biliary dyskinesia    Plan:     We reviewed his pathology report. He is released to full activities. He appears to be doing well. I congratulated him on his upcoming wedding. Followup when necessary  Mary Sella. Andrey Campanile, MD, FACS General, Bariatric, & Minimally Invasive Surgery [facility], [place]

## 2011-12-22 NOTE — Patient Instructions (Signed)
Apply sunscreen to incisions to prevent scarring No restrictions

## 2011-12-25 ENCOUNTER — Encounter (INDEPENDENT_AMBULATORY_CARE_PROVIDER_SITE_OTHER): Payer: Self-pay

## 2012-08-26 ENCOUNTER — Ambulatory Visit (INDEPENDENT_AMBULATORY_CARE_PROVIDER_SITE_OTHER): Payer: BC Managed Care – PPO | Admitting: Internal Medicine

## 2012-08-26 VITALS — BP 120/80 | HR 66 | Temp 98.0°F | Resp 16 | Ht 71.0 in | Wt 184.0 lb

## 2012-08-26 DIAGNOSIS — N529 Male erectile dysfunction, unspecified: Secondary | ICD-10-CM

## 2012-08-26 DIAGNOSIS — Z7189 Other specified counseling: Secondary | ICD-10-CM

## 2012-08-26 DIAGNOSIS — Z719 Counseling, unspecified: Secondary | ICD-10-CM

## 2012-08-26 LAB — POCT CBC
Hemoglobin: 14.3 g/dL (ref 14.1–18.1)
MCH, POC: 28.7 pg (ref 27–31.2)
MCHC: 32.1 g/dL (ref 31.8–35.4)
MID (cbc): 0.5 (ref 0–0.9)
MPV: 10.2 fL (ref 0–99.8)
POC Granulocyte: 3.8 (ref 2–6.9)
POC MID %: 8.4 %M (ref 0–12)
Platelet Count, POC: 208 10*3/uL (ref 142–424)
RBC: 4.98 M/uL (ref 4.69–6.13)
WBC: 6.5 10*3/uL (ref 4.6–10.2)

## 2012-08-26 LAB — COMPREHENSIVE METABOLIC PANEL
ALT: 19 U/L (ref 0–53)
AST: 20 U/L (ref 0–37)
Albumin: 4.9 g/dL (ref 3.5–5.2)
Alkaline Phosphatase: 58 U/L (ref 39–117)
Calcium: 10 mg/dL (ref 8.4–10.5)
Chloride: 103 mEq/L (ref 96–112)
Potassium: 4.9 mEq/L (ref 3.5–5.3)
Sodium: 138 mEq/L (ref 135–145)
Total Protein: 6.8 g/dL (ref 6.0–8.3)

## 2012-08-26 LAB — POCT URINALYSIS DIPSTICK
Bilirubin, UA: NEGATIVE
Glucose, UA: NEGATIVE
Nitrite, UA: NEGATIVE
Urobilinogen, UA: 0.2

## 2012-08-26 LAB — POCT UA - MICROSCOPIC ONLY
Bacteria, U Microscopic: NEGATIVE
Casts, Ur, LPF, POC: NEGATIVE
WBC, Ur, HPF, POC: NEGATIVE
Yeast, UA: NEGATIVE

## 2012-08-26 LAB — LIPID PANEL
LDL Cholesterol: 83 mg/dL (ref 0–99)
VLDL: 17 mg/dL (ref 0–40)

## 2012-08-26 MED ORDER — SILDENAFIL CITRATE 50 MG PO TABS: 50.0000 mg | ORAL_TABLET | Freq: Every day | ORAL | Status: DC | PRN

## 2012-08-26 NOTE — Progress Notes (Signed)
  Subjective:    Patient ID: Matthew Garcia, male    DOB: Jul 05, 1980, 32 y.o.   MRN: [id number]  HPI Has decreased libido, happily married, not sure why, able to get erections and ejaculate normally. No unusual stresses, works hard very fit.   Review of Systems GB disease    Objective:   Physical Exam  Vitals reviewed. Constitutional: He appears well-developed and well-nourished.  Cardiovascular: Normal rate.   Pulmonary/Chest: Effort normal and breath sounds normal.  Abdominal: Hernia confirmed negative in the right inguinal area and confirmed negative in the left inguinal area.  Genitourinary: Rectum normal, prostate normal, testes normal and penis normal. Circumcised.  Lymphadenopathy:       Right: No inguinal adenopathy present.       Left: No inguinal adenopathy present.   Results for orders placed in visit on 08/26/12  POCT CBC      Result Value Range   WBC 6.5  4.6 - 10.2 K/uL   Lymph, poc 2.1  0.6 - 3.4   POC LYMPH PERCENT 32.7  10 - 50 %L   MID (cbc) 0.5  0 - 0.9   POC MID % 8.4  0 - 12 %M   POC Granulocyte 3.8  2 - 6.9   Granulocyte percent 58.9  37 - 80 %G   RBC 4.98  4.69 - 6.13 M/uL   Hemoglobin 14.3  14.1 - 18.1 g/dL   HCT, POC 40.9  81.1 - 53.7 %   MCV 89.6  80 - 97 fL   MCH, POC 28.7  27 - 31.2 pg   MCHC 32.1  31.8 - 35.4 g/dL   RDW, POC 91.4     Platelet Count, POC 208  142 - 424 K/uL   MPV 10.2  0 - 99.8 fL  POCT UA - MICROSCOPIC ONLY      Result Value Range   WBC, Ur, HPF, POC neg     RBC, urine, microscopic neg     Bacteria, U Microscopic neg     Mucus, UA neg     Epithelial cells, urine per micros neg     Crystals, Ur, HPF, POC neg     Casts, Ur, LPF, POC neg     Yeast, UA neg    POCT URINALYSIS DIPSTICK      Result Value Range   Color, UA pale yellow     Clarity, UA clear     Glucose, UA neg     Bilirubin, UA neg     Ketones, UA neg     Spec Grav, UA 1.010     Blood, UA neg     pH, UA 7.0     Protein, UA neg     Urobilinogen, UA  0.2     Nitrite, UA neg     Leukocytes, UA Negative            Assessment & Plan:  Libido issue Quit smoking/Curb ETOH Viagra 50mg  trial/Counsel

## 2012-08-26 NOTE — Patient Instructions (Signed)
Stress Stress-related medical problems are becoming increasingly common. The body has a built-in physical response to stressful situations. Faced with pressure, challenge or danger, we need to react quickly. Our bodies release hormones such as cortisol and adrenaline to help do this. These hormones are part of the "fight or flight" response and affect the metabolic rate, heart rate and blood pressure, resulting in a heightened, stressed state that prepares the body for optimum performance in dealing with a stressful situation. It is likely that early man required these mechanisms to stay alive, but usually modern stresses do not call for this, and the same hormones released in today's world can damage health and reduce coping ability. CAUSES  Pressure to perform at work, at school or in sports.  Threats of physical violence.  Money worries.  Arguments.  Family conflicts.  Divorce or separation from significant other.  Bereavement.  New job or unemployment.  Changes in location.  Alcohol or drug abuse. SOMETIMES, THERE IS NO PARTICULAR REASON FOR DEVELOPING STRESS. Almost all people are at risk of being stressed at some time in their lives. It is important to know that some stress is temporary and some is long term.  Temporary stress will go away when a situation is resolved. Most people can cope with short periods of stress, and it can often be relieved by relaxing, taking a walk, chatting through issues with friends, or having a good night's sleep.  Chronic (long-term, continuous) stress is much harder to deal with. It can be psychologically and emotionally damaging. It can be harmful both for an individual and for friends and family. SYMPTOMS Everyone reacts to stress differently. There are some common effects that help us recognize it. In times of extreme stress, people may:  Shake uncontrollably.  Breathe faster and deeper than normal (hyperventilate).  Vomit.  For people  with asthma, stress can trigger an attack.  For some people, stress may trigger migraine headaches, ulcers, and body pain. PHYSICAL EFFECTS OF STRESS MAY INCLUDE:  Loss of energy.  Skin problems.  Aches and pains resulting from tense muscles, including neck ache, backache and tension headaches.  Increased pain from arthritis and other conditions.  Irregular heart beat (palpitations).  Periods of irritability or anger.  Apathy or depression.  Anxiety (feeling uptight or worrying).  Unusual behavior.  Loss of appetite.  Comfort eating.  Lack of concentration.  Loss of, or decreased, sex-drive.  Increased smoking, drinking, or recreational drug use.  For women, missed periods.  Ulcers, joint pain, and muscle pain. Post-traumatic stress is the stress caused by any serious accident, strong emotional damage, or extremely difficult or violent experience such as rape or war. Post-traumatic stress victims can experience mixtures of emotions such as fear, shame, depression, guilt or anger. It may include recurrent memories or images that may be haunting. These feelings can last for weeks, months or even years after the traumatic event that triggered them. Specialized treatment, possibly with medicines and psychological therapies, is available. If stress is causing physical symptoms, severe distress or making it difficult for you to function as normal, it is worth seeing your caregiver. It is important to remember that although stress is a usual part of life, extreme or prolonged stress can lead to other illnesses that will need treatment. It is better to visit a doctor sooner rather than later. Stress has been linked to the development of high blood pressure and heart disease, as well as insomnia and depression. There is no diagnostic test for   stress since everyone reacts to it differently. But a caregiver will be able to spot the physical symptoms, such  as:  Headaches.  Shingles.  Ulcers. Emotional distress such as intense worry, low mood or irritability should be detected when the doctor asks pertinent questions to identify any underlying problems that might be the cause. In case there are physical reasons for the symptoms, the doctor may also want to do some tests to exclude certain conditions. If you feel that you are suffering from stress, try to identify the aspects of your life that are causing it. Sometimes you may not be able to change or avoid them, but even a small change can have a positive ripple effect. A simple lifestyle change can make all the difference. STRATEGIES THAT CAN HELP DEAL WITH STRESS:  Delegating or sharing responsibilities.  Avoiding confrontations.  Learning to be more assertive.  Regular exercise.  Avoid using alcohol or street drugs to cope.  Eating a healthy, balanced diet, rich in fruit and vegetables and proteins.  Finding humor or absurdity in stressful situations.  Never taking on more than you know you can handle comfortably.  Organizing your time better to get as much done as possible.  Talking to friends or family and sharing your thoughts and fears.  Listening to music or relaxation tapes.  Tensing and then relaxing your muscles, starting at the toes and working up to the head and neck. If you think that you would benefit from help, either in identifying the things that are causing your stress or in learning techniques to help you relax, see a caregiver who is capable of helping you with this. Rather than relying on medications, it is usually better to try and identify the things in your life that are causing stress and try to deal with them. There are many techniques of managing stress including counseling, psychotherapy, aromatherapy, yoga, and exercise. Your caregiver can help you determine what is best for you. Document Released: 07/25/2002 Document Revised: 07/27/2011 Document  Reviewed: 06/21/2007 Exit[employer] Patient Information 2013 [employer].  

## 2012-08-29 LAB — TESTOSTERONE, FREE, TOTAL, SHBG
Sex Hormone Binding: 56 nmol/L (ref 13–71)
Testosterone, Free: 59.5 pg/mL (ref 47.0–244.0)
Testosterone: 416 ng/dL (ref 300–890)

## 2012-12-09 ENCOUNTER — Ambulatory Visit: Payer: BC Managed Care – PPO

## 2012-12-09 ENCOUNTER — Ambulatory Visit (INDEPENDENT_AMBULATORY_CARE_PROVIDER_SITE_OTHER): Payer: BC Managed Care – PPO | Admitting: Internal Medicine

## 2012-12-09 VITALS — BP 112/72 | HR 53 | Temp 97.9°F | Resp 18 | Ht 70.0 in | Wt 186.0 lb

## 2012-12-09 DIAGNOSIS — R002 Palpitations: Secondary | ICD-10-CM

## 2012-12-09 DIAGNOSIS — R42 Dizziness and giddiness: Secondary | ICD-10-CM

## 2012-12-09 DIAGNOSIS — K047 Periapical abscess without sinus: Secondary | ICD-10-CM

## 2012-12-09 DIAGNOSIS — R0602 Shortness of breath: Secondary | ICD-10-CM

## 2012-12-09 DIAGNOSIS — N529 Male erectile dysfunction, unspecified: Secondary | ICD-10-CM | POA: Insufficient documentation

## 2012-12-09 MED ORDER — CLINDAMYCIN HCL 300 MG PO CAPS: 300.0000 mg | ORAL_CAPSULE | Freq: Three times a day (TID) | ORAL | Status: DC

## 2012-12-09 NOTE — Patient Instructions (Addendum)
Palpitations  A palpitation is the feeling that your heartbeat is irregular or is faster than normal. It may feel like your heart is fluttering or skipping a beat. Palpitations are usually not a serious problem. However, in some cases, you may need further medical evaluation. CAUSES  Palpitations can be caused by:  Smoking.  Caffeine or other stimulants, such as diet pills or energy drinks.  Alcohol.  Stress and anxiety.  Strenuous physical activity.  Fatigue.  Certain medicines.  Heart disease, especially if you have a history of arrhythmias. This includes atrial fibrillation, atrial flutter, or supraventricular tachycardia.  An improperly working pacemaker or defibrillator. DIAGNOSIS  To find the cause of your palpitations, your caregiver will take your history and perform a physical exam. Tests may also be done, including:  Electrocardiography (ECG). This test records the heart's electrical activity.  Cardiac monitoring. This allows your caregiver to monitor your heart rate and rhythm in real time.  Holter monitor. This is a portable device that records your heartbeat and can help diagnose heart arrhythmias. It allows your caregiver to track your heart activity for several days, if needed.  Stress tests by exercise or by giving medicine that makes the heart beat faster. TREATMENT  Treatment of palpitations depends on the cause of your symptoms and can vary greatly. Most cases of palpitations do not require any treatment other than time, relaxation, and monitoring your symptoms. Other causes, such as atrial fibrillation, atrial flutter, or supraventricular tachycardia, usually require further treatment. HOME CARE INSTRUCTIONS   Avoid:  Caffeinated coffee, tea, soft drinks, diet pills, and energy drinks.  Chocolate.  Alcohol.  Stop smoking if you smoke.  Reduce your stress and anxiety. Things that can help you relax include:  A method that measures bodily functions so  you can learn to control them (biofeedback).  Yoga.  Meditation.  Physical activity such as swimming, jogging, or walking.  Get plenty of rest and sleep. SEEK MEDICAL CARE IF:   You continue to have a fast or irregular heartbeat beyond 24 hours.  Your palpitations occur more often. SEEK IMMEDIATE MEDICAL CARE IF:  You develop chest pain or shortness of breath.  You have a severe headache.  You feel dizzy, or you faint. MAKE SURE YOU:  Understand these instructions.  Will watch your condition.  Will get help right away if you are not doing well or get worse. Document Released: 05/01/2000 Document Revised: 11/03/2011 Document Reviewed: 07/03/2011 [facility] Patient Information 2014 [place], [place]. Tooth Decay  Tooth decay happens when a tooth breaks down. This break down can cause a hole in the tooth that can get bigger and deeper over time. Treatment is needed right away. HOME CARE To prevent tooth decay:  Brush your teeth and floss every day.  Make and keep all regular check-ups and cleanings with your dentist. GET HELP RIGHT AWAY IF:   You have a fever over 102 F (38.9 C).  You see redness and puffiness (swelling) of the face, jaw, or neck.  You cannot open your mouth.  You have severe tooth pain not helped by medicine. Document Released: 02/11/2008 Document Revised: 07/27/2011 Document Reviewed: 02/11/2008 [facility] Patient Information 2014 [place], [place].

## 2012-12-09 NOTE — Progress Notes (Signed)
  Subjective:    Patient ID: Matthew Garcia, male    DOB: February 14, 1981, 32 y.o.   MRN: [id number]  HPI C/O brief spells of dizzyness, sob, palpitations. Has quit smoking. No assoc chest pain or syncope. Has fam hx MI father, GM. Has 2 first cousins with congenital arrythmia requiring pacemakers to live. Dr. Graciela Husbands [facility] cardiology their doctor. Also has an abscessed tooth.  Review of Systems     Objective:   Physical Exam  Vitals reviewed. Constitutional: He is oriented to person, place, and time. He appears well-developed and well-nourished. No distress.  Eyes: EOM are normal. No scleral icterus.  Cardiovascular: Normal rate, regular rhythm, normal heart sounds and intact distal pulses.   Pulmonary/Chest: Effort normal and breath sounds normal.  Neurological: He is alert and oriented to person, place, and time. Coordination normal.  Psychiatric: He has a normal mood and affect. His behavior is normal.   EKG normal   [facility] reading (PRIMARY) by  Dr.Emran Molzahn. Normal cxr      Assessment & Plan:  Refer to Dr. Graciela Husbands cardiology/Palpitations and dizzyness Clindamycin 300mg  tid for abscessed tooth Consider urology consult for ED

## 2012-12-20 ENCOUNTER — Encounter (HOSPITAL_BASED_OUTPATIENT_CLINIC_OR_DEPARTMENT_OTHER): Payer: Self-pay | Admitting: *Deleted

## 2012-12-20 ENCOUNTER — Emergency Department (HOSPITAL_BASED_OUTPATIENT_CLINIC_OR_DEPARTMENT_OTHER): Payer: BC Managed Care – PPO

## 2012-12-20 ENCOUNTER — Emergency Department (HOSPITAL_BASED_OUTPATIENT_CLINIC_OR_DEPARTMENT_OTHER)
Admission: EM | Admit: 2012-12-20 | Discharge: 2012-12-20 | Disposition: A | Discharge status: Home / Self Care | Payer: BC Managed Care – PPO | Attending: Emergency Medicine | Admitting: Emergency Medicine

## 2012-12-20 DIAGNOSIS — Y92410 Unspecified street and highway as the place of occurrence of the external cause: Secondary | ICD-10-CM | POA: Insufficient documentation

## 2012-12-20 DIAGNOSIS — Z87891 Personal history of nicotine dependence: Secondary | ICD-10-CM | POA: Insufficient documentation

## 2012-12-20 DIAGNOSIS — T148XXA Other injury of unspecified body region, initial encounter: Secondary | ICD-10-CM

## 2012-12-20 DIAGNOSIS — R011 Cardiac murmur, unspecified: Secondary | ICD-10-CM | POA: Insufficient documentation

## 2012-12-20 DIAGNOSIS — Z87442 Personal history of urinary calculi: Secondary | ICD-10-CM | POA: Insufficient documentation

## 2012-12-20 DIAGNOSIS — S301XXA Contusion of abdominal wall, initial encounter: Secondary | ICD-10-CM | POA: Insufficient documentation

## 2012-12-20 DIAGNOSIS — Y9389 Activity, other specified: Secondary | ICD-10-CM | POA: Insufficient documentation

## 2012-12-20 DIAGNOSIS — S0990XA Unspecified injury of head, initial encounter: Secondary | ICD-10-CM | POA: Insufficient documentation

## 2012-12-20 DIAGNOSIS — R51 Headache: Secondary | ICD-10-CM | POA: Insufficient documentation

## 2012-12-20 DIAGNOSIS — Z880 Allergy status to penicillin: Secondary | ICD-10-CM | POA: Insufficient documentation

## 2012-12-20 MED ORDER — IBUPROFEN 600 MG PO TABS: 600.0000 mg | ORAL_TABLET | Freq: Four times a day (QID) | ORAL | Status: DC | PRN

## 2012-12-20 MED ORDER — HYDROCODONE-ACETAMINOPHEN 5-325 MG PO TABS
1.0000 | ORAL_TABLET | Freq: Once | ORAL | Status: AC
  Administered 2012-12-20: 1 via ORAL
  Filled 2012-12-20: qty 1

## 2012-12-20 MED ORDER — IBUPROFEN 400 MG PO TABS
600.0000 mg | ORAL_TABLET | Freq: Once | ORAL | Status: AC
  Administered 2012-12-20: 600 mg via ORAL
  Filled 2012-12-20: qty 1

## 2012-12-20 NOTE — ED Notes (Signed)
Patient transported to CT via stretcher per tech. 
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
## 2012-12-22 ENCOUNTER — Encounter (HOSPITAL_COMMUNITY): Payer: Self-pay | Admitting: *Deleted

## 2012-12-22 ENCOUNTER — Emergency Department (HOSPITAL_COMMUNITY): Payer: BC Managed Care – PPO

## 2012-12-22 ENCOUNTER — Emergency Department (HOSPITAL_COMMUNITY)
Admission: EM | Admit: 2012-12-22 | Discharge: 2012-12-22 | Disposition: A | Discharge status: Home / Self Care | Payer: BC Managed Care – PPO | Attending: Emergency Medicine | Admitting: Emergency Medicine

## 2012-12-22 DIAGNOSIS — IMO0002 Reserved for concepts with insufficient information to code with codable children: Secondary | ICD-10-CM | POA: Insufficient documentation

## 2012-12-22 DIAGNOSIS — G44309 Post-traumatic headache, unspecified, not intractable: Secondary | ICD-10-CM

## 2012-12-22 DIAGNOSIS — Z880 Allergy status to penicillin: Secondary | ICD-10-CM | POA: Insufficient documentation

## 2012-12-22 DIAGNOSIS — F0781 Postconcussional syndrome: Secondary | ICD-10-CM | POA: Insufficient documentation

## 2012-12-22 DIAGNOSIS — R011 Cardiac murmur, unspecified: Secondary | ICD-10-CM | POA: Insufficient documentation

## 2012-12-22 DIAGNOSIS — Z87442 Personal history of urinary calculi: Secondary | ICD-10-CM | POA: Insufficient documentation

## 2012-12-22 DIAGNOSIS — R209 Unspecified disturbances of skin sensation: Secondary | ICD-10-CM | POA: Insufficient documentation

## 2012-12-22 DIAGNOSIS — Y92410 Unspecified street and highway as the place of occurrence of the external cause: Secondary | ICD-10-CM | POA: Insufficient documentation

## 2012-12-22 DIAGNOSIS — Y9389 Activity, other specified: Secondary | ICD-10-CM | POA: Insufficient documentation

## 2012-12-22 DIAGNOSIS — Z87891 Personal history of nicotine dependence: Secondary | ICD-10-CM | POA: Insufficient documentation

## 2012-12-22 DIAGNOSIS — Z792 Long term (current) use of antibiotics: Secondary | ICD-10-CM | POA: Insufficient documentation

## 2012-12-22 MED ORDER — CYCLOBENZAPRINE HCL 10 MG PO TABS: 10.0000 mg | ORAL_TABLET | Freq: Two times a day (BID) | ORAL | Status: DC | PRN

## 2012-12-22 MED ORDER — SODIUM CHLORIDE 0.9 % IV BOLUS (SEPSIS)
1000.0000 mL | Freq: Once | INTRAVENOUS | Status: AC
  Administered 2012-12-22: 1000 mL via INTRAVENOUS

## 2012-12-22 MED ORDER — CYCLOBENZAPRINE HCL 10 MG PO TABS
5.0000 mg | ORAL_TABLET | Freq: Once | ORAL | Status: AC
  Administered 2012-12-22: 5 mg via ORAL
  Filled 2012-12-22 (×2): qty 1

## 2012-12-22 MED ORDER — DIPHENHYDRAMINE HCL 50 MG/ML IJ SOLN
25.0000 mg | Freq: Once | INTRAMUSCULAR | Status: AC
  Administered 2012-12-22: 25 mg via INTRAVENOUS
  Filled 2012-12-22: qty 1

## 2012-12-22 MED ORDER — HYDROCODONE-ACETAMINOPHEN 5-325 MG PO TABS: 1.0000 | ORAL_TABLET | Freq: Four times a day (QID) | ORAL | Status: DC | PRN

## 2012-12-22 MED ORDER — DEXAMETHASONE SODIUM PHOSPHATE 10 MG/ML IJ SOLN
10.0000 mg | Freq: Once | INTRAMUSCULAR | Status: AC
  Administered 2012-12-22: 10 mg via INTRAVENOUS
  Filled 2012-12-22: qty 1

## 2012-12-22 MED ORDER — METOCLOPRAMIDE HCL 5 MG/ML IJ SOLN
10.0000 mg | Freq: Once | INTRAMUSCULAR | Status: AC
  Administered 2012-12-22: 10 mg via INTRAVENOUS
  Filled 2012-12-22: qty 2
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
## 2012-12-22 NOTE — ED Notes (Signed)
Patient transported to CT 
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
## 2013-02-01 ENCOUNTER — Encounter: Payer: Self-pay | Admitting: *Deleted

## 2013-02-01 DIAGNOSIS — R011 Cardiac murmur, unspecified: Secondary | ICD-10-CM | POA: Insufficient documentation

## 2013-02-01 DIAGNOSIS — N200 Calculus of kidney: Secondary | ICD-10-CM | POA: Insufficient documentation

## 2013-02-02 ENCOUNTER — Encounter: Payer: Self-pay | Admitting: Cardiovascular Disease

## 2013-02-02 ENCOUNTER — Ambulatory Visit (INDEPENDENT_AMBULATORY_CARE_PROVIDER_SITE_OTHER): Payer: BC Managed Care – PPO | Admitting: Cardiovascular Disease

## 2013-02-02 VITALS — BP 115/82 | HR 66 | Wt 197.0 lb

## 2013-02-02 DIAGNOSIS — R011 Cardiac murmur, unspecified: Secondary | ICD-10-CM

## 2013-02-02 DIAGNOSIS — R0609 Other forms of dyspnea: Secondary | ICD-10-CM

## 2013-02-02 DIAGNOSIS — R002 Palpitations: Secondary | ICD-10-CM

## 2013-02-02 DIAGNOSIS — R0600 Dyspnea, unspecified: Secondary | ICD-10-CM | POA: Insufficient documentation

## 2013-02-02 NOTE — Patient Instructions (Signed)
Your physician recommends that you schedule a follow-up appointment in: AS NEEDED Your physician recommends that you continue on your current medications as directed. Please refer to the Current Medication list given to you today.  Your physician has requested that you have a stress echocardiogram. For further information please visit [email or web address] Please follow instruction sheet as given.  Your physician has requested that you have a cardiac MRI. Cardiac MRI uses a computer to create images of your heart as its beating, producing both still and moving pictures of your heart and major blood vessels. For further information please visit [email or web address]. Please follow the instruction sheet given to you today for more information.

## 2013-02-02 NOTE — Progress Notes (Signed)
Patient ID: Matthew Garcia, male   DOB: 04/19/1981, 32 y.o.   MRN: [id number] 32 yo referred for palitations , lightheadedness and dsypnea.  Started a few months ago when he quit smoking.  No history of chronic lung or cardiac disease. Has a cousin with ARVD ( gene positive Argie Ramming 12/23/77) with AICD and sees Graciela Husbands.  No persistant rapid palpitations Flip flops that are intermitant and only 2-3x/month.  Occasional lightheaded but not always related to palpitations. No chest pain.  Denies excess ETOH or drugs Works for [employer].  Wife with him and seems very concerned.    ROS: Denies fever, malais, weight loss, blurry vision, decreased visual acuity, cough, sputum, SOB, hemoptysis, pleuritic pain, palpitaitons, heartburn, abdominal pain, melena, lower extremity edema, claudication, or rash.  All other systems reviewed and negative   General: Affect appropriate Healthy:  appears stated age HEENT: normal Neck supple with no adenopathy JVP normal no bruits no thyromegaly Lungs clear with no wheezing and good diaphragmatic motion Heart:  S1/S2 no murmur,rub, gallop or click PMI normal Abdomen: benighn, BS positve, no tenderness, no AAA no bruit.  No HSM or HJR Distal pulses intact with no bruits No edema Neuro non-focal Skin warm and dry No muscular weakness  Medications Current Outpatient Prescriptions  Medication Sig Dispense Refill  . cyclobenzaprine (FLEXERIL) 10 MG tablet Take 1 tablet (10 mg total) by mouth 2 (two) times daily as needed for muscle spasms.  20 tablet  0  . ibuprofen (ADVIL,MOTRIN) 600 MG tablet Take 1 tablet (600 mg total) by mouth every 6 (six) hours as needed for pain.  30 tablet  0   No current facility-administered medications for this visit.    Allergies Penicillins  Family History: No family history on file.  Social History: History   Social History  . Marital Status: Single    Spouse Name: N/A    Number of Children: N/A  . Years of  Education: N/A   Occupational History  . Not on file.   Social History Main Topics  . Smoking status: Former Smoker -- 1.00 packs/day for 15 years    Types: Cigarettes    Quit date: 08/16/2012  . Smokeless tobacco: Never Used  . Alcohol Use: Yes     Comment: socially  . Drug Use: No  . Sexual Activity: Yes    Birth Control/ Protection: None   Other Topics Concern  . Not on file   Social History Narrative  . No narrative on file    Electrocardiogram:7./15  SR rate 50 normal ECG   Assessment and Plan

## 2013-02-02 NOTE — Assessment & Plan Note (Signed)
With family history of ARVD will order cardiac MRI to assess RV and LV function r/o fatty infiltration

## 2013-02-02 NOTE — Assessment & Plan Note (Signed)
No obvious cardiopulm abnormalities  F/U stress echo given relative bradycardia on ECG  R/O chronotropic incompetence and ischemia

## 2013-02-13 ENCOUNTER — Encounter: Payer: Self-pay | Admitting: Cardiovascular Disease

## 2013-02-17 ENCOUNTER — Ambulatory Visit (HOSPITAL_BASED_OUTPATIENT_CLINIC_OR_DEPARTMENT_OTHER): Payer: BC Managed Care – PPO

## 2013-02-17 ENCOUNTER — Other Ambulatory Visit (HOSPITAL_COMMUNITY): Payer: Self-pay | Admitting: Cardiovascular Disease

## 2013-02-17 ENCOUNTER — Ambulatory Visit (HOSPITAL_COMMUNITY): Payer: BC Managed Care – PPO | Attending: Cardiovascular Disease | Admitting: Radiology

## 2013-02-17 DIAGNOSIS — R002 Palpitations: Secondary | ICD-10-CM

## 2013-02-17 DIAGNOSIS — R0602 Shortness of breath: Secondary | ICD-10-CM

## 2013-02-17 DIAGNOSIS — R011 Cardiac murmur, unspecified: Secondary | ICD-10-CM

## 2013-02-17 DIAGNOSIS — R0989 Other specified symptoms and signs involving the circulatory and respiratory systems: Secondary | ICD-10-CM

## 2013-02-28 ENCOUNTER — Ambulatory Visit (HOSPITAL_COMMUNITY)
Admission: RE | Admit: 2013-02-28 | Discharge: 2013-02-28 | Disposition: A | Discharge status: Home / Self Care | Payer: BC Managed Care – PPO | Source: Ambulatory Visit | Attending: Cardiovascular Disease | Admitting: Cardiovascular Disease

## 2013-02-28 DIAGNOSIS — R002 Palpitations: Secondary | ICD-10-CM

## 2013-02-28 DIAGNOSIS — R011 Cardiac murmur, unspecified: Secondary | ICD-10-CM

## 2013-02-28 LAB — CREATININE, SERUM: GFR calc Af Amer: >90 mL/min (ref >90)

## 2013-02-28 MED ORDER — GADOBENATE DIMEGLUMINE 529 MG/ML IV SOLN
25.0000 mL | Freq: Once | INTRAVENOUS | Status: AC
  Administered 2013-02-28: 24 mL via INTRAVENOUS

## 2013-04-10 NOTE — Progress Notes (Signed)
Donevin Sainsbury M. Shavonne Ambroise, MD, FACS General, Bariatric, & Minimally In[facility]  

## 2013-08-16 ENCOUNTER — Ambulatory Visit: Payer: BC Managed Care – PPO

## 2013-08-16 ENCOUNTER — Ambulatory Visit (INDEPENDENT_AMBULATORY_CARE_PROVIDER_SITE_OTHER): Payer: BC Managed Care – PPO | Admitting: Family Medicine

## 2013-08-16 VITALS — BP 120/80 | HR 63 | Temp 98.2°F | Resp 16 | Ht 69.5 in | Wt 205.0 lb

## 2013-08-16 DIAGNOSIS — M549 Dorsalgia, unspecified: Secondary | ICD-10-CM

## 2013-08-16 DIAGNOSIS — S335XXA Sprain of ligaments of lumbar spine, initial encounter: Secondary | ICD-10-CM

## 2013-08-16 DIAGNOSIS — S39012A Strain of muscle, fascia and tendon of lower back, initial encounter: Secondary | ICD-10-CM

## 2013-08-16 MED ORDER — METHOCARBAMOL 500 MG PO TABS: 500.0000 mg | ORAL_TABLET | Freq: Four times a day (QID) | ORAL | Status: DC

## 2013-08-16 MED ORDER — HYDROCODONE-ACETAMINOPHEN 5-325 MG PO TABS: 1.0000 | ORAL_TABLET | Freq: Four times a day (QID) | ORAL | Status: DC | PRN

## 2013-08-16 MED ORDER — DICLOFENAC SODIUM 75 MG PO TBEC: 75.0000 mg | DELAYED_RELEASE_TABLET | Freq: Two times a day (BID) | ORAL | Status: DC

## 2013-08-16 NOTE — Patient Instructions (Signed)
Take the muscle relaxant methocarbamol one with breakfast, one with lunch, one with supper, and 2 at bedtime. If they make you to drowsy decrease the dose.  Take diclofenac one twice daily with breakfast and supper for pain and inflammation  Take the hydrocodone only when needed for severe pain, can use every 4-6 hours. Avoid excessive use because they can be habit-forming  Rest  Alternate ice and heat on low back.
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
## 2014-07-06 ENCOUNTER — Ambulatory Visit (INDEPENDENT_AMBULATORY_CARE_PROVIDER_SITE_OTHER): Payer: 59 | Admitting: Family

## 2014-07-06 ENCOUNTER — Encounter: Payer: Self-pay | Admitting: Family

## 2014-07-06 ENCOUNTER — Other Ambulatory Visit (INDEPENDENT_AMBULATORY_CARE_PROVIDER_SITE_OTHER): Payer: 59

## 2014-07-06 VITALS — BP 148/98 | HR 61 | Temp 97.9°F | Resp 18 | Ht 70.0 in | Wt 215.4 lb

## 2014-07-06 DIAGNOSIS — R5383 Other fatigue: Secondary | ICD-10-CM

## 2014-07-06 DIAGNOSIS — R6882 Decreased libido: Secondary | ICD-10-CM

## 2014-07-06 LAB — CBC
HCT: 42.5 % (ref 39.0–52.0)
Hemoglobin: 15 g/dL (ref 13.0–17.0)
MCHC: 35.3 g/dL (ref 30.0–36.0)
MCV: 84.5 fl (ref 78.0–100.0)
PLATELETS: 200 10*3/uL (ref 150.0–400.0)
RBC: 5.03 Mil/uL (ref 4.22–5.81)
RDW: 12.4 % (ref 11.5–15.5)
WBC: 7.6 10*3/uL (ref 4.0–10.5)

## 2014-07-06 LAB — BASIC METABOLIC PANEL
BUN: 8 mg/dL (ref 6–23)
CO2: 31 mEq/L (ref 19–32)
CREATININE: 0.93 mg/dL (ref 0.40–1.50)
Calcium: 9.8 mg/dL (ref 8.4–10.5)
Chloride: 103 mEq/L (ref 96–112)
GFR: 97.96 mL/min (ref >60.00)
Glucose, Bld: 104 mg/dL — ABNORMAL HIGH (ref 70–99)
Potassium: 4.2 mEq/L (ref 3.5–5.1)
SODIUM: 140 meq/L (ref 135–145)

## 2014-07-06 LAB — TSH: TSH: 2.85 u[IU]/mL (ref 0.35–4.50)

## 2014-07-06 NOTE — Assessment & Plan Note (Signed)
Symptoms of decreased libido and energy for about 2 years. Obtain testosterone, TSH, CBC, and basic metabolic panel. Discussed potential causes of decreased libido and fatigue with the patient. Follow up pending blood work.

## 2014-07-06 NOTE — Progress Notes (Signed)
Pre visit review using our clinic review tool, if applicable. No additional management support is needed unless otherwise documented below in the visit note. 

## 2014-07-06 NOTE — Progress Notes (Signed)
Subjective:    Patient ID: Matthew Norwayonald L Mundo Jr., male    DOB: 05/05/1981, 34 y.o.   MRN: [id number]  Chief Complaint  Patient presents with  . Establish Care    HPI:  Matthew NorwayRonald L Polyakov Jr. is a 34 y.o. male who presents today to establish care and discuss his decreased libido.  Decreased energy/libido - Notes he was previously able to work out and now is experiencing the associated symptom of decreased energy and decreased libido. This has been going on for about 2 years now. Has tried multivitamins, however this did not provide any relief. Denies any factors that make it worse. Indicates that this feeling is slowly getting worse.  Allergies  Allergen Reactions  . Penicillins Other (See Comments)    Unknown (as infant)    No current outpatient prescriptions on file prior to visit.   No current facility-administered medications on file prior to visit.    Past Medical History  Diagnosis Date  . Heart murmur   . Kidney stones   . Erectile dysfunction   . Chicken pox     Past Surgical History  Procedure Laterality Date  . Shoulder surgery    . Hernia repair    . Clavicle surgery    . Cholecystectomy  12/09/2011    Procedure: LAPAROSCOPIC CHOLECYSTECTOMY;  Surgeon: Atilano InaEric M Wilson, MD,FACS;  Location: [facility];  Service: General;  Laterality: N/A;    Family History  Problem Relation Age of Onset  . Arthritis Mother   . Heart attack Father   . Heart disease Maternal Grandmother   . Stroke Paternal Grandmother   . Healthy Paternal Grandfather     History   Social History  . Marital Status: Married    Spouse Name: N/A  . Number of Children: 0  . Years of Education: 12   Occupational History  . Heavy Arboriculturistquipment Operator    Social History Main Topics  . Smoking status: Former Smoker -- 1.00 packs/day for 15 years    Types: Cigarettes    Quit date: 08/16/2012  . Smokeless tobacco: Never Used  . Alcohol Use: Yes     Comment: socially  . Drug Use: No  . Sexual  Activity: Yes    Birth Control/ Protection: None   Other Topics Concern  . Not on file   Social History Narrative   [place], [place] Currently resides in a house. 1 dog 1 cat. Fun: Tinker with a jeep, hang out with his wife.     Review of Systems  Constitutional: Positive for fatigue.  Psychiatric/Behavioral: Negative for dysphoric mood. The patient is not nervous/anxious.       Objective:    BP 148/98 mmHg  Pulse 61  Temp(Src) 97.9 F (36.6 C) (Oral)  Resp 18  Ht 5\' 10"  (1.778 m)  Wt 215 lb 6.4 oz (97.705 kg)  BMI 30.91 kg/m2  SpO2 99% Nursing note and vital signs reviewed.  Physical Exam  Constitutional: He is oriented to person, place, and time. He appears well-developed and well-nourished. No distress.  Neck: Neck supple. No thyromegaly present.  Cardiovascular: Normal rate, regular rhythm, normal heart sounds and intact distal pulses.   Pulmonary/Chest: Effort normal and breath sounds normal.  Neurological: He is alert and oriented to person, place, and time.  Skin: Skin is warm and dry.  Psychiatric: He has a normal mood and affect. His behavior is normal. Judgment and thought content normal.       Assessment &  Plan:

## 2014-07-06 NOTE — Patient Instructions (Signed)
Thank you for choosing [facility].  Summary/Instructions:  Please stop by the lab on the basement level of the building for your blood work. Your results will be released to MyChart (or called to you) after review, usually within 72 hours after test completion. If any changes need to be made, you will be notified at that same time.  Referrals have been made during this visit. You should expect to hear back from our schedulers in about 7-10 days in regards to establishing an appointment with the specialists we discussed.   If your symptoms worsen or fail to improve, please contact our office for further instruction, or in case of emergency go directly to the emergency room at the closest medical facility.   Testosterone This test is used to determine if your testosterone level is abnormal. This could be used to explain difficulty getting an erection (erectile dysfunction), inability of your partner to get pregnant (infertility), premature or delayed puberty if you are male, or the appearance of masculine physical features if you are male. PREPARATION FOR TEST A blood sample is obtained by inserting a needle into a vein in the arm. NORMAL FINDINGS  Free Testosterone: 0.3-2 pg/mL  % Free Testosterone: 0.1%-0.3% Total Testosterone:  7 mos-9 yrs (Tanner Stage I)  Male: Less than 30 ng/dL  Male: Less than 30 ng/dL  16-10 yrs (Tanner Stage II)  Male: Less than 300 ng/dL  Male: Less than 40 ng/dL  96-04 yrs (Tanner Stage III)  Male: 170-540 ng/dL  Male: Less than 60 ng/dL  54-09 yrs (Tanner Stage IV, V)  Male: 250-910 ng/dL  Male: Less than 70 ng/dL  20 yrs and over  Male: [phone] ng/dL  Male: Less than 70 ng/dL Ranges for normal findings may vary among different laboratories and hospitals. You should always check with your doctor after having lab work or other tests done to discuss the meaning of your test results and whether your values are considered  within normal limits. MEANING OF TEST  Your caregiver will go over the test results with you and discuss the importance and meaning of your results, as well as treatment options and the need for additional tests if necessary. OBTAINING THE TEST RESULTS It is your responsibility to obtain your test results. Ask the lab or department performing the test when and how you will get your results. Document Released: 05/21/2004 Document Revised: 07/27/2011 Document Reviewed: 08/30/2013 [facility] Patient Information 2015 [place], [place]. This information is not intended to replace advice given to you by your health care provider. Make sure you discuss any questions you have with your health care provider.   Fatigue Fatigue is a feeling of tiredness, lack of energy, lack of motivation, or feeling tired all the time. Having enough rest, good nutrition, and reducing stress will normally reduce fatigue. Consult your caregiver if it persists. The nature of your fatigue will help your caregiver to find out its cause. The treatment is based on the cause.  CAUSES  There are many causes for fatigue. Most of the time, fatigue can be traced to one or more of your habits or routines. Most causes fit into one or more of three general areas. They are: Lifestyle problems  Sleep disturbances.  Overwork.  Physical exertion.  Unhealthy habits.  Poor eating habits or eating disorders.  Alcohol and/or drug use .  Lack of proper nutrition (malnutrition). Psychological problems  Stress and/or anxiety problems.  Depression.  Grief.  Boredom. Medical Problems or Conditions  Anemia.  Pregnancy.  Thyroid gland problems.  Recovery from major surgery.  Continuous pain.  Emphysema or asthma that is not well controlled  Allergic conditions.  Diabetes.  Infections (such as mononucleosis).  Obesity.  Sleep disorders, such as sleep apnea.  Heart failure or other heart-related  problems.  Cancer.  Kidney disease.  Liver disease.  Effects of certain medicines such as antihistamines, cough and cold remedies, prescription pain medicines, heart and blood pressure medicines, drugs used for treatment of cancer, and some antidepressants. SYMPTOMS  The symptoms of fatigue include:   Lack of energy.  Lack of drive (motivation).  Drowsiness.  Feeling of indifference to the surroundings. DIAGNOSIS  The details of how you feel help guide your caregiver in finding out what is causing the fatigue. You will be asked about your present and past health condition. It is important to review all medicines that you take, including prescription and non-prescription items. A thorough exam will be done. You will be questioned about your feelings, habits, and normal lifestyle. Your caregiver may suggest blood tests, urine tests, or other tests to look for common medical causes of fatigue.  TREATMENT  Fatigue is treated by correcting the underlying cause. For example, if you have continuous pain or depression, treating these causes will improve how you feel. Similarly, adjusting the dose of certain medicines will help in reducing fatigue.  HOME CARE INSTRUCTIONS   Try to get the required amount of good sleep every night.  Eat a healthy and nutritious diet, and drink enough water throughout the day.  Practice ways of relaxing (including yoga or meditation).  Exercise regularly.  Make plans to change situations that cause stress. Act on those plans so that stresses decrease over time. Keep your work and personal routine reasonable.  Avoid street drugs and minimize use of alcohol.  Start taking a daily multivitamin after consulting your caregiver. SEEK MEDICAL CARE IF:   You have persistent tiredness, which cannot be accounted for.  You have fever.  You have unintentional weight loss.  You have headaches.  You have disturbed sleep throughout the night.  You are feeling  sad.  You have constipation.  You have dry skin.  You have gained weight.  You are taking any new or different medicines that you suspect are causing fatigue.  You are unable to sleep at night.  You develop any unusual swelling of your legs or other parts of your body. SEEK IMMEDIATE MEDICAL CARE IF:   You are feeling confused.  Your vision is blurred.  You feel faint or pass out.  You develop severe headache.  You develop severe abdominal, pelvic, or back pain.  You develop chest pain, shortness of breath, or an irregular or fast heartbeat.  You are unable to pass a normal amount of urine.  You develop abnormal bleeding such as bleeding from the rectum or you vomit blood.  You have thoughts about harming yourself or committing suicide.  You are worried that you might harm someone else. MAKE SURE YOU:   Understand these instructions.  Will watch your condition.  Will get help right away if you are not doing well or get worse. Document Released: 03/01/2007 Document Revised: 07/27/2011 Document Reviewed: 09/05/2013 [facility] Patient Information 2015 [place], [place]. This information is not intended to replace advice given to you by your health care provider. Make sure you discuss any questions you have with your health care provider.

## 2014-07-07 ENCOUNTER — Encounter: Payer: Self-pay | Admitting: Family

## 2014-07-09 ENCOUNTER — Other Ambulatory Visit (INDEPENDENT_AMBULATORY_CARE_PROVIDER_SITE_OTHER): Payer: 59

## 2014-07-09 DIAGNOSIS — R6882 Decreased libido: Secondary | ICD-10-CM

## 2014-07-09 LAB — TESTOSTERONE, FREE, TOTAL, SHBG
SEX HORMONE BINDING: 31 nmol/L (ref 10–50)
TESTOSTERONE-% FREE: 2 % (ref 1.6–2.9)
TESTOSTERONE: 274 ng/dL — AB (ref 300–890)
Testosterone, Free: 55.4 pg/mL (ref 47.0–244.0)

## 2014-07-09 LAB — LUTEINIZING HORMONE: LH: 2.31 m[IU]/mL (ref 1.50–9.30)

## 2014-07-09 LAB — FOLLICLE STIMULATING HORMONE: FSH: 3.3 m[IU]/mL (ref 1.4–18.1)

## 2014-07-10 ENCOUNTER — Encounter: Payer: Self-pay | Admitting: Family

## 2014-07-10 LAB — TESTOSTERONE, FREE, TOTAL, SHBG
Sex Hormone Binding: 29 nmol/L (ref 10–50)
TESTOSTERONE FREE: 44.2 pg/mL — AB (ref 47.0–244.0)
TESTOSTERONE-% FREE: 2.1 % (ref 1.6–2.9)
TESTOSTERONE: 215 ng/dL — AB (ref 300–890)

## 2014-07-12 ENCOUNTER — Telehealth: Payer: Self-pay | Admitting: Family

## 2014-07-12 NOTE — Telephone Encounter (Signed)
Is requesting call back in regards to recent test.

## 2014-07-12 NOTE — Telephone Encounter (Signed)
Spoke with pt. He is interested in trying a supplement to remedy the low testosterone value.  Pt is also interested in his TDAP. Is it okay to give this to him.    [facility] [place].

## 2014-07-16 ENCOUNTER — Encounter: Payer: Self-pay | Admitting: Family

## 2014-07-16 ENCOUNTER — Ambulatory Visit (INDEPENDENT_AMBULATORY_CARE_PROVIDER_SITE_OTHER): Payer: 59 | Admitting: Family

## 2014-07-16 ENCOUNTER — Telehealth: Payer: Self-pay | Admitting: Family

## 2014-07-16 VITALS — BP 152/98 | HR 77 | Temp 97.5°F | Resp 18 | Ht 70.0 in | Wt 215.0 lb

## 2014-07-16 DIAGNOSIS — E291 Testicular hypofunction: Secondary | ICD-10-CM | POA: Insufficient documentation

## 2014-07-16 DIAGNOSIS — Z23 Encounter for immunization: Secondary | ICD-10-CM

## 2014-07-16 MED ORDER — CLOMIPHENE CITRATE 50 MG PO TABS: 25.0000 mg | ORAL_TABLET | Freq: Every day | ORAL | Status: DC

## 2014-07-16 NOTE — Telephone Encounter (Signed)
Pt called stated his pharmacy going to faxed over PA request for clomiPHENE (CLOMID) 50 MG tablet. Please get this done so he can get his med (per pt)

## 2014-07-16 NOTE — Patient Instructions (Signed)
Thank you for choosing [facility].  Summary/Instructions:  Your prescription(s) have been submitted to your pharmacy or been printed and provided for you. Please take as directed and contact our office if you believe you are having problem(s) with the medication(s) or have any questions.  If your symptoms worsen or fail to improve, please contact our office for further instruction, or in case of emergency go directly to the emergency room at the closest medical facility.   Clomiphene tablets What is this medicine? CLOMIPHENE (KLOE mi feen) is a fertility drug that increases the chance of pregnancy. It helps women ovulate (produce a mature egg) during their cycle. This medicine may be used for other purposes; ask your health care provider or pharmacist if you have questions. COMMON BRAND NAME(S): Clomid, Serophene What should I tell my health care provider before I take this medicine? They need to know if you have any of these conditions: -adrenal gland disease -blood vessel disease or blood clots -cyst on the ovary -endometriosis -liver disease -ovarian cancer -pituitary gland disease -vaginal bleeding that has not been evaluated -an unusual or allergic reaction to clomiphene, other medicines, foods, dyes, or preservatives -pregnant (should not be used if you are already pregnant) -breast-feeding How should I use this medicine? Take this medicine by mouth with a glass of water. Follow the directions on the prescription label. Take exactly as directed for the exact number of days prescribed. Take your doses at regular intervals. Most women take this medicine for a 5 day period, but the length of treatment may be adjusted. Your doctor will give you a start date for this medication and will give you instructions on proper use. Do not take your medicine more often than directed. Talk to your pediatrician regarding the use of this medicine in children. Special care may be  needed. Overdosage: If you think you have taken too much of this medicine contact a poison control center or emergency room at once. NOTE: This medicine is only for you. Do not share this medicine with others. What if I miss a dose? If you miss a dose, take it as soon as you can. If it is almost time for your next dose, take only that dose. Do not take double or extra doses. What may interact with this medicine? -herbal or dietary supplements, like blue cohosh, black cohosh, chasteberry, or DHEA -prasterone This list may not describe all possible interactions. Give your health care provider a list of all the medicines, herbs, non-prescription drugs, or dietary supplements you use. Also tell them if you smoke, drink alcohol, or use illegal drugs. Some items may interact with your medicine. What should I watch for while using this medicine? Make sure you understand how and when to use this medicine. You need to know when you are ovulating and when to have sexual intercourse. This will increase the chance of a pregnancy. Visit your doctor or health care professional for regular checks on your progress. You may need tests to check the hormone levels in your blood or you may have to use home-urine tests to check for ovulation. Try to keep any appointments. Compared to other fertility treatments, this medicine does not greatly increase your chances of having multiple babies. An increased chance of having twins may occur in roughly 5 out of every 100 women who take this medication. Stop taking this medicine at once and contact your doctor or health care professional if you think you are pregnant. This medicine is not for   long-term use. Most women that benefit from this medicine do so within the first three cycles (months). Your doctor or health care professional will monitor your condition. This medicine is usually used for a total of 6 cycles of treatment. You may get drowsy or dizzy. Do not drive, use  machinery, or do anything that needs mental alertness until you know how this drug affects you. Do not stand or sit up quickly. This reduces the risk of dizzy or fainting spells. Drinking alcoholic beverages or smoking tobacco may decrease your chance of becoming pregnant. Limit or stop alcohol and tobacco use during your fertility treatments. What side effects may I notice from receiving this medicine? Side effects that you should report to your doctor or health care professional as soon as possible: -allergic reactions like skin rash, itching or hives, swelling of the face, lips, or tongue -breathing problems -changes in vision -fluid retention -nausea, vomiting -pelvic pain or bloating -severe abdominal pain -sudden weight gain Side effects that usually do not require medical attention (report to your doctor or health care professional if they continue or are bothersome): -breast discomfort -hot flashes -mild pelvic discomfort -mild nausea This list may not describe all possible side effects. Call your doctor for medical advice about side effects. You may report side effects to FDA at 1-800-FDA-1088. Where should I keep my medicine? Keep out of the reach of children. Store at room temperature between 15 and 30 degrees C (59 and 86 degrees F). Protect from heat, light, and moisture. Throw away any unused medicine after the expiration date. NOTE: This sheet is a summary. It may not cover all possible information. If you have questions about this medicine, talk to your doctor, pharmacist, or health care provider.  2015, [employer]vier/[employer] Standard. (2007-08-15 22:21:06)   

## 2014-07-16 NOTE — Telephone Encounter (Signed)
PA done waiting to hear back

## 2014-07-16 NOTE — Assessment & Plan Note (Signed)
Discussed testosterone replacement therapy with patient and his wife. They're currently going to be attempting infertility treatments. Recommend starting clomiphene first before supplementing testosterone. Start Clomid 25 mg daily. Follow-up testosterone in 1 month.

## 2014-07-16 NOTE — Progress Notes (Addendum)
   Subjective:    Patient ID: Matthew Norwayonald L Snipe Jr., male    DOB: 01/14/1981, 34 y.o.   MRN: [id number]  Chief Complaint  Patient presents with  . Follow-up    talk about testosterone, would like tetanus    HPI:  Matthew NorwayRonald L Harig Jr. is a 34 y.o. male who presents today for follow up. His wife is present for today's visit.   Recently completed multiple testosterone tests revealing hypogonadism. Patient presents today wishing to discuss testosterone replacement therapy. He continues to experience the symptoms of decreased energy/fatigue and decreased libido. He had previously tried multivitamins which provided no relief to his symptoms. His wife expresses that they are working with infertility specialists in attempts to increase their family size.    Lab Results  Component Value Date   TESTOSTERONE 215* 07/09/2014   BP Readings from Last 3 Encounters:  07/16/14 152/98  07/06/14 148/98  08/16/13 120/80    Allergies  Allergen Reactions  . Penicillins Other (See Comments)    Unknown (as infant)    No current outpatient prescriptions on file prior to visit.   No current facility-administered medications on file prior to visit.    Review of Systems  Constitutional: Positive for fatigue.      Objective:    BP 152/98 mmHg  Pulse 77  Temp(Src) 97.5 F (36.4 C) (Oral)  Resp 18  Ht 5\' 10"  (1.778 m)  Wt 215 lb (97.523 kg)  BMI 30.85 kg/m2  SpO2 98% Nursing note and vital signs reviewed.  Physical Exam  Constitutional: He is oriented to person, place, and time. He appears well-developed and well-nourished. No distress.  Cardiovascular: Normal rate, regular rhythm, normal heart sounds and intact distal pulses.   Pulmonary/Chest: Effort normal and breath sounds normal.  Neurological: He is alert and oriented to person, place, and time.  Skin: Skin is warm and dry.  Psychiatric: He has a normal mood and affect. His behavior is normal. Judgment and thought content normal.        Assessment & Plan:

## 2014-07-16 NOTE — Progress Notes (Signed)
Pre visit review using our clinic review tool, if applicable. No additional management support is needed unless otherwise documented below in the visit note. 

## 2014-07-18 ENCOUNTER — Other Ambulatory Visit: Payer: Self-pay | Admitting: Family

## 2014-07-18 DIAGNOSIS — E291 Testicular hypofunction: Secondary | ICD-10-CM

## 2014-07-26 ENCOUNTER — Ambulatory Visit: Payer: 59 | Admitting: Endocrinology

## 2014-08-06 ENCOUNTER — Ambulatory Visit (INDEPENDENT_AMBULATORY_CARE_PROVIDER_SITE_OTHER): Payer: 59 | Admitting: Endocrinology

## 2014-08-06 ENCOUNTER — Encounter: Payer: Self-pay | Admitting: Endocrinology

## 2014-08-06 VITALS — BP 122/75 | HR 67 | Temp 97.8°F | Resp 16 | Ht 70.0 in | Wt 211.6 lb

## 2014-08-06 DIAGNOSIS — E230 Hypopituitarism: Secondary | ICD-10-CM

## 2014-08-06 DIAGNOSIS — R5383 Other fatigue: Secondary | ICD-10-CM

## 2014-08-06 LAB — TESTOSTERONE: Testosterone: 441.43 ng/dL (ref 300.00–890.00)

## 2014-08-06 NOTE — Progress Notes (Signed)
Patient ID: Matthew Norwayonald L Terlecki Jr., male   DOB: 04/30/1981, 34 y.o.   MRN: [id number]          Chief complaint:  Low energy level  History of Present Illness  He thinks his energy level has been poor for several years. About 10 years ago he took some testosterone injections for increasing his energy level and muscle strength.  At that time he had some decreased endurance level and mild fatigue.  He thinks he felt better with injections and he stopped these after about 2-3 rounds of taking the injections for about 10 weeks each  In 2014 he was being evaluated for decreased libido and fatigue and his testosterone was normal More recently has had progressively increasing complaints of fatigue, decreased motivation, lack of energy and decreased libido He is physically active at work and he does not think he is having the same amount of strength in his muscles His wife says that his libido has been really low in the last year or so. Also he has had some low mood levels and irritability    There is no history of the following: Hot flushes, sweats, breast enlargement, Recent metabolic steroid use, history of testicular injury or mumps in childhood. No history of  low impact fracture  Prior lab results show baseline freetestosterone level of 44 which is low  Lab Results  Component Value Date   TESTOSTERONE 215* 07/09/2014    Prolactin level: Not checked LH level: 2.3  About 3 weeks ago he was seen by his PCP or evaluation. Because he had expressed interest in starting a family he was started on Clomid 25 mg daily He has felt more energy with this and also improve libido and some improvement in his mood. However he is not able to afford this and is here for further management          Medication List       This list is accurate as of: 08/06/14 12:49 PM.  Always use your most recent med list.               clomiPHENE 50 MG tablet  Commonly known as:  CLOMID  Take 0.5  tablets (25 mg total) by mouth daily.        Allergies:  Allergies  Allergen Reactions  . Penicillins Other (See Comments)    Unknown (as infant)    Past Medical History  Diagnosis Date  . Heart murmur   . Kidney stones   . Erectile dysfunction   . Chicken pox     Past Surgical History  Procedure Laterality Date  . Shoulder surgery    . Hernia repair    . Clavicle surgery    . Cholecystectomy  12/09/2011    Procedure: LAPAROSCOPIC CHOLECYSTECTOMY;  Surgeon: Atilano InaEric M Wilson, MD,FACS;  Location: [facility];  Service: General;  Laterality: N/A;    Family History  Problem Relation Age of Onset  . Arthritis Mother   . Heart attack Father   . Heart disease Maternal Grandmother   . Stroke Paternal Grandmother   . Healthy Paternal Grandfather   . Diabetes Neg Hx     Social History:  reports that he quit smoking about 1 years ago. His smoking use included Cigarettes. He has a 15 pack-year smoking history. He has never used smokeless tobacco. He reports that he drinks alcohol. He reports that he does not use illicit drugs.  Review of Systems  Constitutional: Positive for malaise/fatigue.  Eyes:  Negative for blurred vision.  Respiratory: Negative for shortness of breath.   Cardiovascular: Negative for palpitations and leg swelling.  Gastrointestinal: Negative for nausea.  Musculoskeletal: Negative for joint pain.  Neurological: Negative for headaches.       May have headaches only if he is out in the sun a lot  Endo/Heme/Allergies: Negative for polydipsia.       No cold intolerance  Psychiatric/Behavioral: Positive for depression.    No history of unusual headaches  No history of blurred vision including peripheral vision History of hypertension: No history of abnormal fasting glucose or diabetes+  Lab Results  Component Value Date   TSH 2.85 07/06/2014   TSH 3.083 08/26/2012    General Examination:   BP 122/75 mmHg  Pulse 67  Temp(Src) 97.8 F (36.6 C)  Resp 16   Ht  (1.778 m)  Wt 211 lb 9.6 oz (95.981 kg)  BMI 30.36 kg/m2  SpO2 97%  GENERAL APPEARANCE well-built and nourished HEENT:Oral mucosa normal. Normal oropharyngeal opening.  EYES:normal external appearance of eyes.  Fundus exam shows normal optic discs  NECK:no lymphadenopathy, no thyromegaly.  CHEST: Gynecomastia present bilaterally, more on the left with breast tissue measuring about 3-3.5 cm across.  No tenderness LUNGS:clear to auscultation bilaterally, no wheezes, rhonchi, rales.   HEART:normal S1 And S2, no S3, S4, murmur or click.  ABDOMEN:no hepatosplenomegaly, no masses palpated, soft and not tender.   MALE GENITOURINARY:Testicles appear normal in size about 3.5-4 cm bilaterally MUSCULOSKELETALNo enlargement or deformity of joints.  EXTREMITIES:no clubbing, no edema.  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 He is significantly symptomatic from this and also on exam has mild gynecomastia Does not have any testicular atrophy on exam Etiology of his hypogonadism is unclear as he does not have any features of insulin resistance syndrome He needs further evaluation of his pituitary function especially with his significant symptoms of fatigue  Recommended having his free T4, IGF and prolactin level tested today If abnormal would consider pituitary imaging  Although clomiphene may work if he has primarily hypothalamic cause of his hypogonadism not clear if this is a long-term option and may be used temporarily for improving his testosterone level and fertility Also discussed that he may need to see a fertility specialist if needed He was given the option of  continuing follow-up with PCP and having testosterone levels monitored also; however apparently he needs prior authorization for his clomiphene from an endocrinologist  Currently he is subjectively improving with taking clomiphene and will check his testosterone level again today Advised him that he will need long-term testosterone supplementation once he does not have need for fertility.  Also discussed that transdermal preparations are more physiological and will give him more stable levels than injections  Total visit time including review of records, H&P and counseling = 60 minutes  Amiley Shishido 08/06/2014, 12:49 PM

## 2014-08-07 LAB — INSULIN-LIKE GROWTH FACTOR: INSULIN LIKE GF 1: 125 ng/mL (ref 88–246)

## 2014-08-07 LAB — PROLACTIN: PROLACTIN: 9.5 ng/mL (ref 4.0–15.2)

## 2014-08-07 LAB — T4, FREE: Free T4: 1.03 ng/dL (ref 0.60–1.60)

## 2014-08-08 NOTE — Progress Notes (Signed)
Quick Note:  Please let patient know that the testosterone lab result is normal.  Will need to send new prescription for clomiphene which will require prior authorization ______

## 2014-08-13 ENCOUNTER — Encounter: Payer: Self-pay | Admitting: Endocrinology

## 2014-08-14 ENCOUNTER — Other Ambulatory Visit: Payer: Self-pay | Admitting: *Deleted

## 2014-08-14 MED ORDER — CLOMIPHENE CITRATE 50 MG PO TABS: 25.0000 mg | ORAL_TABLET | Freq: Every day | ORAL | Status: DC

## 2014-08-14 NOTE — Telephone Encounter (Signed)
Need Prior Auth on med Clomiphene 50 mg

## 2014-08-16 ENCOUNTER — Telehealth: Payer: Self-pay

## 2014-08-16 NOTE — Telephone Encounter (Signed)
Received another PA request from [facility] on the Cl[facility]id. I saw that he went to Dr. Lucianne MussKumar for testosterone check and etc. Just wanted to see if any codes that were given in that visit would be acceptable to try to do the PA again. Please advise.

## 2014-08-16 NOTE — Telephone Encounter (Signed)
Check with Dr. Remus BlakeKumar's office as they may be trying a prior authorization according to the notes.

## 2014-08-17 ENCOUNTER — Telehealth: Payer: Self-pay | Admitting: Endocrinology

## 2014-08-17 NOTE — Telephone Encounter (Signed)
Please read message below and advise.  

## 2014-08-17 NOTE — Telephone Encounter (Signed)
Pt called wants to know if appeal letter was sent to insurance and if it was marked urgent and he only has one pill left please call pt back ASAP

## 2014-08-17 NOTE — Telephone Encounter (Signed)
A PA was done for this, pharmacy denied again, Dr. Lucianne MussKumar needs to write an appeal letter to try and get insurance to cover rx.

## 2014-08-19 ENCOUNTER — Encounter: Payer: Self-pay | Admitting: Endocrinology

## 2014-08-19 NOTE — Telephone Encounter (Signed)
Letter saved, please print

## 2014-08-20 DIAGNOSIS — Z0279 Encounter for issue of other medical certificate: Secondary | ICD-10-CM

## 2014-08-20 NOTE — Telephone Encounter (Signed)
Letter mailed

## 2014-08-20 NOTE — Telephone Encounter (Signed)
Left message on patients vm informing him that the letter was mailed to his insurance company.

## 2014-08-28 ENCOUNTER — Other Ambulatory Visit: Payer: Self-pay

## 2014-08-28 NOTE — Telephone Encounter (Signed)
PA for clomid resent to [facility] [facility]

## 2014-08-29 NOTE — Telephone Encounter (Signed)
Just to clarify, was the Appeal mailed?

## 2014-08-29 NOTE — Telephone Encounter (Signed)
It was done and mailed

## 2014-08-29 NOTE — Telephone Encounter (Signed)
PA denied.  Please advise if an appeal needs to be done.

## 2014-11-16 ENCOUNTER — Telehealth: Payer: Self-pay | Admitting: Family

## 2014-11-16 MED ORDER — CLOMIPHENE CITRATE 50 MG PO TABS: 25.0000 mg | ORAL_TABLET | Freq: Every day | ORAL | Status: DC

## 2014-11-16 NOTE — Telephone Encounter (Signed)
Medication refilled

## 2014-11-16 NOTE — Telephone Encounter (Signed)
Patients last office visit feb/2016--patient should have enough left from previous refill in march until greg calone returns---are you ok with refilling---please advise, thanks

## 2014-11-16 NOTE — Telephone Encounter (Signed)
Patient need refill of clomiphene 50 mg

## 2015-02-26 ENCOUNTER — Ambulatory Visit (INDEPENDENT_AMBULATORY_CARE_PROVIDER_SITE_OTHER): Payer: 59 | Admitting: Family

## 2015-02-26 ENCOUNTER — Encounter: Payer: Self-pay | Admitting: Family

## 2015-02-26 VITALS — BP 122/86 | HR 71 | Temp 98.5°F | Resp 18 | Ht 70.0 in | Wt 212.0 lb

## 2015-02-26 DIAGNOSIS — E291 Testicular hypofunction: Secondary | ICD-10-CM | POA: Diagnosis not present

## 2015-02-26 NOTE — Progress Notes (Signed)
   Subjective:    Patient ID: Matthew Garcia., male    DOB: 26-Sep-1980, 33 y.o.   MRN: [id number]  Chief Complaint  Patient presents with  . Follow-up    for the most part medication has been working good, states he has been having, numbness and tingling in places since taking the medication    HPI:  Matthew Garcia. is a 34 y.o. male who  has a past medical history of Heart murmur; Kidney stones; Erectile dysfunction; and Chicken pox. and presents today for a follow up office visit.   1.) Male hypogonadism - Currently maintained on clomiphene. Takes the medication as prescribed. Reports some numbness and tingling located in both his scrotum and penis. Notes that he feels the numbness when sitting down and it takes a little while for it to go away. He also notes that his penis is slightly drawn into his body a little.   Lab Results  Component Value Date   TESTOSTERONE 441.43 08/06/2014   Allergies  Allergen Reactions  . Penicillins Other (See Comments)    Unknown (as infant)     Current Outpatient Prescriptions on File Prior to Visit  Medication Sig Dispense Refill  . clomiPHENE (CLOMID) 50 MG tablet Take 0.5 tablets (25 mg total) by mouth daily. 30 tablet 3   No current facility-administered medications on file prior to visit.    Review of Systems  Constitutional: Negative for fatigue.  Genitourinary: Negative for penile swelling, scrotal swelling and testicular pain.  Neurological: Negative for headaches.      Objective:    BP 122/86 mmHg  Pulse 71  Temp(Src) 98.5 F (36.9 C) (Oral)  Resp 18  Ht  (1.778 m)  Wt 212 lb (96.163 kg)  BMI 30.42 kg/m2  SpO2 98% Nursing note and vital signs reviewed.  Physical Exam  Constitutional: He is oriented to person, place, and time. He appears well-developed and well-nourished. No distress.  Cardiovascular: Normal rate, regular rhythm, normal heart sounds and intact distal pulses.   Pulmonary/Chest: Effort  normal and breath sounds normal.  Neurological: He is alert and oriented to person, place, and time.  Skin: Skin is warm and dry.  Psychiatric: He has a normal mood and affect. His behavior is normal. Judgment and thought content normal.       Assessment & Plan:   Problem List Items Addressed This Visit      Endocrine   Hypogonadism in male - Primary    Currently maintained on clomiphene and has been experiencing numbness and tingling in his genital region over the past couple of months. Hold clomiphene at this time. Obtain testosterone. Discussion with patient indicates deciding not to have additional children at this time. Discussed options of testosterone replacement therapy. Follow up pending blood work.       Relevant Orders   Testosterone

## 2015-02-26 NOTE — Assessment & Plan Note (Signed)
Currently maintained on clomiphene and has been experiencing numbness and tingling in his genital region over the past couple of months. Hold clomiphene at this time. Obtain testosterone. Discussion with patient indicates deciding not to have additional children at this time. Discussed options of testosterone replacement therapy. Follow up pending blood work.

## 2015-02-26 NOTE — Patient Instructions (Signed)
Thank you for choosing [facility].  Summary/Instructions:  Hold on your clomiphene for now to see if symptoms resolve.   Check testosterone levels at your convenience between 7:30-9:30.   Please stop by the lab on the basement level of the building for your blood work. Your results will be released to MyChart (or called to you) after review, usually within 72 hours after test completion. If any changes need to be made, you will be notified at that same time.  If your symptoms worsen or fail to improve, please contact our office for further instruction, or in case of emergency go directly to the emergency room at the closest medical facility.

## 2015-02-26 NOTE — Progress Notes (Signed)
Pre visit review using our clinic review tool, if applicable. No additional management support is needed unless otherwise documented below in the visit note. 

## 2015-03-01 ENCOUNTER — Ambulatory Visit: Payer: 59 | Admitting: Family

## 2015-03-26 ENCOUNTER — Telehealth: Payer: Self-pay

## 2015-03-26 NOTE — Telephone Encounter (Signed)
PA initiated via covermymeds. FTD:[id number]

## 2015-03-27 ENCOUNTER — Telehealth: Payer: Self-pay

## 2015-03-27 NOTE — Telephone Encounter (Signed)
Pt called to check on PA. Called pt back and LVM letting him know that PA was done today and we prob will not get an answer for a couple days but I will try and call his insurance company if we do not hear anything soon.

## 2015-04-05 ENCOUNTER — Telehealth: Payer: Self-pay

## 2015-04-05 NOTE — Telephone Encounter (Signed)
Pt called back and wanted to know the determination.   PA was denied and has been resent via cover my meds.   Please contact pt if you see the outcome come through.

## 2015-04-08 NOTE — Telephone Encounter (Signed)
LVM letting pt know that insurance plan will not cover his clomiphene. Advised that he call his insurance for other options.

## 2015-04-08 NOTE — Telephone Encounter (Signed)
If you get time will you please check into this for me. Thank you

## 2015-04-09 ENCOUNTER — Telehealth: Payer: Self-pay | Admitting: Family

## 2015-04-09 DIAGNOSIS — R7989 Other specified abnormal findings of blood chemistry: Secondary | ICD-10-CM

## 2015-04-09 NOTE — Telephone Encounter (Signed)
Please advise 

## 2015-04-09 NOTE — Telephone Encounter (Signed)
The only other option to replace clomid would be testosterone. If the goal is to have children, this will likely reduce his fertility. We can prescribe testosterone replacement therapy if he wishes to continue.

## 2015-04-09 NOTE — Telephone Encounter (Signed)
Pt stated that trying a cream. Since insurance is not covering the clomid he is ready to try the cream.

## 2015-04-09 NOTE — Telephone Encounter (Signed)
Please call pt. He wants to discuss changing medication

## 2015-04-10 ENCOUNTER — Other Ambulatory Visit (INDEPENDENT_AMBULATORY_CARE_PROVIDER_SITE_OTHER): Payer: 59

## 2015-04-10 DIAGNOSIS — R7989 Other specified abnormal findings of blood chemistry: Secondary | ICD-10-CM

## 2015-04-10 DIAGNOSIS — E291 Testicular hypofunction: Secondary | ICD-10-CM | POA: Diagnosis not present

## 2015-04-10 LAB — CBC
HEMATOCRIT: 40.8 % (ref 39.0–52.0)
HEMOGLOBIN: 14.1 g/dL (ref 13.0–17.0)
MCHC: 34.4 g/dL (ref 30.0–36.0)
MCV: 86.6 fl (ref 78.0–100.0)
PLATELETS: 163 10*3/uL (ref 150.0–400.0)
RBC: 4.72 Mil/uL (ref 4.22–5.81)
RDW: 12.3 % (ref 11.5–15.5)
WBC: 6.3 10*3/uL (ref 4.0–10.5)

## 2015-04-10 LAB — TESTOSTERONE: Testosterone: 220.77 ng/dL — ABNORMAL LOW (ref 300.00–890.00)

## 2015-04-10 NOTE — Telephone Encounter (Signed)
Called pt to let him know that CBC was put in and needs to be done before the androgel is sent in. Pt is aware and understands.

## 2015-04-10 NOTE — Addendum Note (Signed)
Addended by: Mercer PodWRENN, Sallie Maker E on: 04/10/2015 10:35 AM   Modules accepted: Orders

## 2015-04-12 ENCOUNTER — Telehealth: Payer: Self-pay | Admitting: Family

## 2015-04-12 NOTE — Telephone Encounter (Signed)
Patient states he went to lab this past wed to have blood work done.

## 2015-04-14 ENCOUNTER — Encounter: Payer: Self-pay | Admitting: Family

## 2015-04-14 MED ORDER — TESTOSTERONE 20.25 MG/ACT (1.62%) TD GEL: 40.5000 g | Freq: Every day | TRANSDERMAL | Status: DC

## 2015-04-14 NOTE — Telephone Encounter (Signed)
Sent to pharmacy 

## 2015-04-15 NOTE — Telephone Encounter (Signed)
LVM letting pt know his rx has been sent to pharmacy

## 2015-05-03 ENCOUNTER — Ambulatory Visit: Payer: Self-pay | Admitting: Family

## 2015-05-06 ENCOUNTER — Telehealth: Payer: Self-pay | Admitting: Family

## 2015-05-06 DIAGNOSIS — E291 Testicular hypofunction: Secondary | ICD-10-CM

## 2015-05-06 DIAGNOSIS — Z5181 Encounter for therapeutic drug level monitoring: Secondary | ICD-10-CM

## 2015-05-06 NOTE — Telephone Encounter (Signed)
Would like to know if he needs to reschedule appointment for testosterone therapy or if he can just go to lab

## 2015-05-07 NOTE — Telephone Encounter (Signed)
Pt aware.

## 2015-05-07 NOTE — Telephone Encounter (Signed)
What lab work needs to be put in?

## 2015-05-07 NOTE — Telephone Encounter (Signed)
Labs placed.

## 2015-05-08 ENCOUNTER — Encounter: Payer: Self-pay | Admitting: Family

## 2015-05-08 ENCOUNTER — Other Ambulatory Visit (INDEPENDENT_AMBULATORY_CARE_PROVIDER_SITE_OTHER): Payer: 59

## 2015-05-08 DIAGNOSIS — Z5181 Encounter for therapeutic drug level monitoring: Secondary | ICD-10-CM

## 2015-05-08 DIAGNOSIS — E291 Testicular hypofunction: Secondary | ICD-10-CM

## 2015-05-08 LAB — CBC
HCT: 42.3 % (ref 39.0–52.0)
Hemoglobin: 14.4 g/dL (ref 13.0–17.0)
MCHC: 34 g/dL (ref 30.0–36.0)
MCV: 87.4 fl (ref 78.0–100.0)
PLATELETS: 162 10*3/uL (ref 150.0–400.0)
RBC: 4.84 Mil/uL (ref 4.22–5.81)
RDW: 12.6 % (ref 11.5–15.5)
WBC: 6.8 10*3/uL (ref 4.0–10.5)

## 2015-05-08 LAB — TESTOSTERONE: Testosterone: 207.28 ng/dL — ABNORMAL LOW (ref 300.00–890.00)

## 2015-05-15 ENCOUNTER — Other Ambulatory Visit: Payer: Self-pay | Admitting: Family

## 2015-05-16 ENCOUNTER — Other Ambulatory Visit: Payer: Self-pay | Admitting: Family

## 2015-05-16 MED ORDER — TESTOSTERONE 20.25 MG/ACT (1.62%) TD GEL: 60.7500 g | Freq: Every day | TRANSDERMAL | Status: DC

## 2015-05-21 NOTE — Addendum Note (Signed)
Addended by: Jeanine LuzALONE, GREGORY D on: 05/21/2015 09:42 PM   Modules accepted: Orders

## 2015-05-23 ENCOUNTER — Telehealth: Payer: Self-pay

## 2015-05-23 NOTE — Telephone Encounter (Signed)
[facility] has come in from [facility].   The last PA was denied. Do you want me to continue with the PA or was there a change in medication that was on the pt formulary.  Pls advise at your convenience.

## 2015-05-24 NOTE — Telephone Encounter (Signed)
No need to continue with clomid PA as there has been a medication change. Thank you!

## 2015-05-31 ENCOUNTER — Other Ambulatory Visit (INDEPENDENT_AMBULATORY_CARE_PROVIDER_SITE_OTHER): Payer: 59

## 2015-05-31 ENCOUNTER — Encounter: Payer: Self-pay | Admitting: Family

## 2015-05-31 ENCOUNTER — Other Ambulatory Visit: Payer: Self-pay | Admitting: Family

## 2015-05-31 DIAGNOSIS — E291 Testicular hypofunction: Secondary | ICD-10-CM

## 2015-05-31 LAB — TESTOSTERONE: Testosterone: 338.81 ng/dL (ref 300.00–890.00)

## 2015-05-31 MED ORDER — TESTOSTERONE 20.25 MG/ACT (1.62%) TD GEL: 81.0000 mg | Freq: Every day | TRANSDERMAL | Status: DC

## 2015-06-04 MED ORDER — TESTOSTERONE ENANTHATE 200 MG/ML IM SOLN: 100.0000 mg | INTRAMUSCULAR | Status: DC

## 2015-06-26 ENCOUNTER — Encounter: Payer: Self-pay | Admitting: Family

## 2015-06-26 DIAGNOSIS — E291 Testicular hypofunction: Secondary | ICD-10-CM

## 2015-07-01 ENCOUNTER — Encounter: Payer: Self-pay | Admitting: Family

## 2015-07-01 ENCOUNTER — Other Ambulatory Visit (INDEPENDENT_AMBULATORY_CARE_PROVIDER_SITE_OTHER): Payer: 59

## 2015-07-01 DIAGNOSIS — E291 Testicular hypofunction: Secondary | ICD-10-CM | POA: Diagnosis not present

## 2015-07-01 LAB — CBC
HEMATOCRIT: 43 % (ref 39.0–52.0)
HEMOGLOBIN: 14.6 g/dL (ref 13.0–17.0)
MCHC: 34.1 g/dL (ref 30.0–36.0)
MCV: 87.9 fl (ref 78.0–100.0)
Platelets: 163 10*3/uL (ref 150.0–400.0)
RBC: 4.89 Mil/uL (ref 4.22–5.81)
RDW: 12.9 % (ref 11.5–15.5)
WBC: 6.9 10*3/uL (ref 4.0–10.5)

## 2015-07-01 LAB — TESTOSTERONE: TESTOSTERONE: 404.44 ng/dL (ref 300.00–890.00)

## 2015-07-05 ENCOUNTER — Other Ambulatory Visit: Payer: Self-pay | Admitting: Family

## 2015-08-27 ENCOUNTER — Other Ambulatory Visit: Payer: Self-pay | Admitting: Family

## 2015-09-04 ENCOUNTER — Encounter: Payer: Self-pay | Admitting: Family

## 2015-09-04 DIAGNOSIS — E291 Testicular hypofunction: Secondary | ICD-10-CM

## 2015-09-04 DIAGNOSIS — Z5181 Encounter for therapeutic drug level monitoring: Secondary | ICD-10-CM

## 2015-09-13 ENCOUNTER — Other Ambulatory Visit: Payer: Self-pay | Admitting: Family

## 2015-09-13 MED ORDER — TESTOSTERONE ENANTHATE 200 MG/ML IM SOLN: 100.0000 mg | INTRAMUSCULAR | Status: DC

## 2015-10-30 ENCOUNTER — Encounter: Payer: Self-pay | Admitting: Family

## 2015-10-31 ENCOUNTER — Encounter: Payer: Self-pay | Admitting: Family

## 2015-10-31 ENCOUNTER — Ambulatory Visit (INDEPENDENT_AMBULATORY_CARE_PROVIDER_SITE_OTHER): Payer: 59 | Admitting: Family

## 2015-10-31 ENCOUNTER — Other Ambulatory Visit (INDEPENDENT_AMBULATORY_CARE_PROVIDER_SITE_OTHER): Payer: 59

## 2015-10-31 VITALS — BP 112/78 | HR 82 | Temp 98.0°F | Resp 16 | Ht 70.0 in | Wt 212.0 lb

## 2015-10-31 DIAGNOSIS — E291 Testicular hypofunction: Secondary | ICD-10-CM

## 2015-10-31 DIAGNOSIS — J069 Acute upper respiratory infection, unspecified: Secondary | ICD-10-CM | POA: Diagnosis not present

## 2015-10-31 DIAGNOSIS — Z5181 Encounter for therapeutic drug level monitoring: Secondary | ICD-10-CM

## 2015-10-31 LAB — CBC
HEMATOCRIT: 46.7 % (ref 39.0–52.0)
HEMOGLOBIN: 15.8 g/dL (ref 13.0–17.0)
MCHC: 33.8 g/dL (ref 30.0–36.0)
MCV: 87.2 fl (ref 78.0–100.0)
PLATELETS: 172 10*3/uL (ref 150.0–400.0)
RBC: 5.35 Mil/uL (ref 4.22–5.81)
RDW: 12.9 % (ref 11.5–15.5)
WBC: 10.5 10*3/uL (ref 4.0–10.5)

## 2015-10-31 LAB — TESTOSTERONE: TESTOSTERONE: 555.02 ng/dL (ref 300.00–890.00)

## 2015-10-31 MED ORDER — AZITHROMYCIN 250 MG PO TABS: ORAL_TABLET | ORAL | Status: DC

## 2015-10-31 MED ORDER — HYDROCOD POLST-CPM POLST ER 10-8 MG/5ML PO SUER: 5.0000 mL | Freq: Every evening | ORAL | Status: DC | PRN

## 2015-10-31 NOTE — Assessment & Plan Note (Signed)
Symptoms and exam consistent with acute upper respiratory infection most likely viral. Treat conservatively with over-the-counter medications as needed for symptom relief and supportive care. Start Tussionex as needed for cough and sleep. Written prescription for azithromycin with instructions for watchful waiting for the next 3-4 days. Follow-up if symptoms worsen or do not improve.

## 2015-10-31 NOTE — Progress Notes (Signed)
Subjective:    Patient ID: Matthew Garcia., male    DOB: 21-Dec-1980, 35 y.o.   MRN: [id number]  Chief Complaint  Patient presents with  . Nasal Congestion    sneezing, cough, chest congestion, itchy eyes, and runny nose since monday, has been taking OTC allergy medication    HPI:  Matthew Parlee. is a 35 y.o. male who  has a past medical history of Heart murmur; Kidney stones; Erectile dysfunction; and Chicken pox. and presents today for an acute office visit.  This is a new problem. Associated symptoms sneezing, coughing, itchy eyes, and runny nose has been going on for 3 days.Possible subjective fever, shortness of breath or wheezing. Endorses mild sinus pressure. Modifying factors include OTC allergy medication, but unsure as brand which did not help very much. Course of the symptoms have slowly worsened. Denies any recent antibiotics.   Allergies  Allergen Reactions  . Penicillins Other (See Comments)    Unknown (as infant)     Current Outpatient Prescriptions on File Prior to Visit  Medication Sig Dispense Refill  . testosterone enanthate (DELATESTRYL) 200 MG/ML injection Inject 0.5 mLs (100 mg total) into the muscle once a week. For IM use only 5 mL 0   No current facility-administered medications on file prior to visit.    Review of Systems  Constitutional: Positive for fever. Negative for chills.  HENT: Positive for congestion, rhinorrhea and sneezing.   Eyes: Positive for itching.  Respiratory: Positive for cough. Negative for chest tightness and shortness of breath.       Objective:    BP 112/78 mmHg  Pulse 82  Temp(Src) 98 F (36.7 C) (Oral)  Resp 16  Ht  (1.778 m)  Wt 212 lb (96.163 kg)  BMI 30.42 kg/m2  SpO2 98% Nursing note and vital signs reviewed.  Physical Exam  Constitutional: He is oriented to person, place, and time. He appears well-developed and well-nourished. No distress.  HENT:  Right Ear: Hearing, tympanic membrane,  external ear and ear canal normal.  Left Ear: Hearing, tympanic membrane, external ear and ear canal normal.  Nose: Right sinus exhibits frontal sinus tenderness. Right sinus exhibits no maxillary sinus tenderness. Left sinus exhibits frontal sinus tenderness. Left sinus exhibits no maxillary sinus tenderness.  Mouth/Throat: Uvula is midline, oropharynx is clear and moist and mucous membranes are normal.  Cardiovascular: Normal rate, regular rhythm, normal heart sounds and intact distal pulses.   Pulmonary/Chest: Effort normal and breath sounds normal.  Neurological: He is alert and oriented to person, place, and time.  Skin: Skin is warm and dry.  Psychiatric: He has a normal mood and affect. His behavior is normal. Judgment and thought content normal.       Assessment & Plan:   Problem List Items Addressed This Visit      Respiratory   Acute upper respiratory infection - Primary    Symptoms and exam consistent with acute upper respiratory infection most likely viral. Treat conservatively with over-the-counter medications as needed for symptom relief and supportive care. Start Tussionex as needed for cough and sleep. Written prescription for azithromycin with instructions for watchful waiting for the next 3-4 days. Follow-up if symptoms worsen or do not improve.      Relevant Medications   azithromycin (ZITHROMAX) 250 MG tablet       I have discontinued Matthew Garcia's clomiPHENE. I am also having him maintain his testosterone enanthate, azithromycin, and chlorpheniramine-HYDROcodone.   Meds ordered this encounter  Medications  . DISCONTD: chlorpheniramine-HYDROcodone (TUSSIONEX PENNKINETIC ER) 10-8 MG/5ML SUER    Sig: Take 5 mLs by mouth at bedtime as needed.    Dispense:  115 mL    Refill:  0    Order Specific Question:  Supervising Provider    Answer:  Hillard DankerRAWFORD, ELIZABETH A [[id number]]  . DISCONTD: azithromycin (ZITHROMAX) 250 MG tablet    Sig: Take 2 tablets by mouth for 1 day  and then 1 tablet by mouth daily for 4 days.    Dispense:  6 tablet    Refill:  0    Order Specific Question:  Supervising Provider    Answer:  Hillard DankerRAWFORD, ELIZABETH A [[id number]]  . azithromycin (ZITHROMAX) 250 MG tablet    Sig: Take 2 tablets by mouth for 1 day and then 1 tablet by mouth daily for 4 days.    Dispense:  6 tablet    Refill:  0    Order Specific Question:  Supervising Provider    Answer:  Hillard DankerRAWFORD, ELIZABETH A [[id number]]  . chlorpheniramine-HYDROcodone (TUSSIONEX PENNKINETIC ER) 10-8 MG/5ML SUER    Sig: Take 5 mLs by mouth at bedtime as needed.    Dispense:  115 mL    Refill:  0    Order Specific Question:  Supervising Provider    Answer:  Hillard DankerRAWFORD, ELIZABETH A [[id number]]     Follow-up: Return if symptoms worsen or fail to improve.  Jeanine Luzalone, Lamaria Hildebrandt, FNP

## 2015-10-31 NOTE — Patient Instructions (Addendum)
Thank you for choosing [facility].  Summary/Instructions:  Your prescription(s) have been submitted to your pharmacy or been printed and provided for you. Please take as directed and contact our office if you believe you are having problem(s) with the medication(s) or have any questions.  If your symptoms worsen or fail to improve, please contact our office for further instruction, or in case of emergency go directly to the emergency room at the closest medical facility.    General Recommendations:    Please drink plenty of fluids.  Get plenty of rest   Sleep in humidified air  Use saline nasal sprays  Netti pot   OTC Medications:  Decongestants - helps relieve congestion   Flonase (generic fluticasone) or Nasacort (generic triamcinolone) - please make sure to use the "cross-over" technique at a 45 degree angle towards the opposite eye as opposed to straight up the nasal passageway.   Sudafed (generic pseudoephedrine - Note this is the one that is available behind the pharmacy counter); Products with phenylephrine (-PE) may also be used but is often not as effective as pseudoephedrine.   If you have HIGH BLOOD PRESSURE - Coricidin HBP; AVOID any product that is -D as this contains pseudoephedrine which may increase your blood pressure.  Afrin (oxymetazoline) every 6-8 hours for up to 3 days.   Allergies - helps relieve runny nose, itchy eyes and sneezing   Claritin (generic loratidine), Allegra (fexofenidine), or Zyrtec (generic cyrterizine) for runny nose. These medications should not cause drowsiness.  Note - Benadryl (generic diphenhydramine) may be used however may cause drowsiness  Cough -   Delsym or Robitussin (generic dextromethorphan)  Expectorants - helps loosen mucus to ease removal   Mucinex (generic guaifenesin) as directed on the package.  Headaches / General Aches   Tylenol (generic acetaminophen) - DO NOT EXCEED 3 grams (3,000 mg) in a 24  hour time period  Advil/Motrin (generic ibuprofen)   Sore Throat -   Salt water gargle   Chloraseptic (generic benzocaine) spray or lozenges / Sucrets (generic dyclonine)    Upper Respiratory Infection, Adult Most upper respiratory infections (URIs) are a viral infection of the air passages leading to the lungs. A URI affects the nose, throat, and upper air passages. The most common type of URI is nasopharyngitis and is typically referred to as "the common cold." URIs run their course and usually go away on their own. Most of the time, a URI does not require medical attention, but sometimes a bacterial infection in the upper airways can follow a viral infection. This is called a secondary infection. Sinus and middle ear infections are common types of secondary upper respiratory infections. Bacterial pneumonia can also complicate a URI. A URI can worsen asthma and chronic obstructive pulmonary disease (COPD). Sometimes, these complications can require emergency medical care and may be life threatening.  CAUSES Almost all URIs are caused by viruses. A virus is a type of germ and can spread from one person to another.  RISKS FACTORS You may be at risk for a URI if:   You smoke.   You have chronic heart or lung disease.  You have a weakened defense (immune) system.   You are very young or very old.   You have nasal allergies or asthma.  You work in crowded or poorly ventilated areas.  You work in health care facilities or schools. SIGNS AND SYMPTOMS  Symptoms typically develop 2-3 days after you come in contact with a cold virus. Most   viral URIs last 7-10 days. However, viral URIs from the influenza virus (flu virus) can last 14-18 days and are typically more severe. Symptoms may include:   Runny or stuffy (congested) nose.   Sneezing.   Cough.   Sore throat.   Headache.   Fatigue.   Fever.   Loss of appetite.   Pain in your forehead, behind your eyes, and  over your cheekbones (sinus pain).  Muscle aches.  DIAGNOSIS  Your health care provider may diagnose a URI by:  Physical exam.  Tests to check that your symptoms are not due to another condition such as:  Strep throat.  Sinusitis.  Pneumonia.  Asthma. TREATMENT  A URI goes away on its own with time. It cannot be cured with medicines, but medicines may be prescribed or recommended to relieve symptoms. Medicines may help:  Reduce your fever.  Reduce your cough.  Relieve nasal congestion. HOME CARE INSTRUCTIONS   Take medicines only as directed by your health care provider.   Gargle warm saltwater or take cough drops to comfort your throat as directed by your health care provider.  Use a warm mist humidifier or inhale steam from a shower to increase air moisture. This may make it easier to breathe.  Drink enough fluid to keep your urine clear or pale yellow.   Eat soups and other clear broths and maintain good nutrition.   Rest as needed.   Return to work when your temperature has returned to normal or as your health care provider advises. You may need to stay home longer to avoid infecting others. You can also use a face mask and careful hand washing to prevent spread of the virus.  Increase the usage of your inhaler if you have asthma.   Do not use any tobacco products, including cigarettes, chewing tobacco, or electronic cigarettes. If you need help quitting, ask your health care provider. PREVENTION  The best way to protect yourself from getting a cold is to practice good hygiene.   Avoid oral or hand contact with people with cold symptoms.   Wash your hands often if contact occurs.  There is no clear evidence that vitamin C, vitamin E, echinacea, or exercise reduces the chance of developing a cold. However, it is always recommended to get plenty of rest, exercise, and practice good nutrition.  SEEK MEDICAL CARE IF:   You are getting worse rather than  better.   Your symptoms are not controlled by medicine.   You have chills.  You have worsening shortness of breath.  You have brown or red mucus.  You have yellow or brown nasal discharge.  You have pain in your face, especially when you bend forward.  You have a fever.  You have swollen neck glands.  You have pain while swallowing.  You have white areas in the back of your throat. SEEK IMMEDIATE MEDICAL CARE IF:   You have severe or persistent:  Headache.  Ear pain.  Sinus pain.  Chest pain.  You have chronic lung disease and any of the following:  Wheezing.  Prolonged cough.  Coughing up blood.  A change in your usual mucus.  You have a stiff neck.  You have changes in your:  Vision.  Hearing.  Thinking.  Mood. MAKE SURE YOU:   Understand these instructions.  Will watch your condition.  Will get help right away if you are not doing well or get worse.   This information is not intended to replace advice   given to you by your health care provider. Make sure you discuss any questions you have with your health care provider.   Document Released: 10/28/2000 Document Revised: 09/18/2014 Document Reviewed: 08/09/2013 Elsevier Interactive Patient Education 2016 [employer].  

## 2015-10-31 NOTE — Progress Notes (Signed)
Pre visit review using our clinic review tool, if applicable. No additional management support is needed unless otherwise documented below in the visit note. 

## 2015-11-08 ENCOUNTER — Other Ambulatory Visit: Payer: Self-pay | Admitting: Family

## 2015-11-11 ENCOUNTER — Telehealth: Payer: Self-pay

## 2015-11-11 NOTE — Telephone Encounter (Signed)
Could not locate printed rx si i called [facility] had to leave refill on pharmacy vm...Raechel Chute/lmb

## 2015-11-11 NOTE — Telephone Encounter (Signed)
Testosterone rx faxed to [facility]

## 2015-12-30 ENCOUNTER — Other Ambulatory Visit: Payer: Self-pay | Admitting: Family

## 2015-12-30 NOTE — Telephone Encounter (Signed)
Rx faxed

## 2016-01-04 ENCOUNTER — Encounter: Payer: Self-pay | Admitting: Family

## 2016-01-05 MED ORDER — CLINDAMYCIN HCL 300 MG PO CAPS: 300.0000 mg | ORAL_CAPSULE | Freq: Three times a day (TID) | ORAL | 0 refills | Status: DC

## 2016-02-04 ENCOUNTER — Other Ambulatory Visit: Payer: Self-pay | Admitting: Family

## 2016-02-06 ENCOUNTER — Encounter: Payer: Self-pay | Admitting: Family

## 2016-02-19 IMAGING — CR DG LUMBAR SPINE COMPLETE 4+V
5 series · 5 of 5 positions shown · non-contrast
Comparison: None.

CLINICAL DATA: Low back pain

EXAM:
LUMBAR SPINE - COMPLETE 4+ VIEW

[AP (1 of 2)]
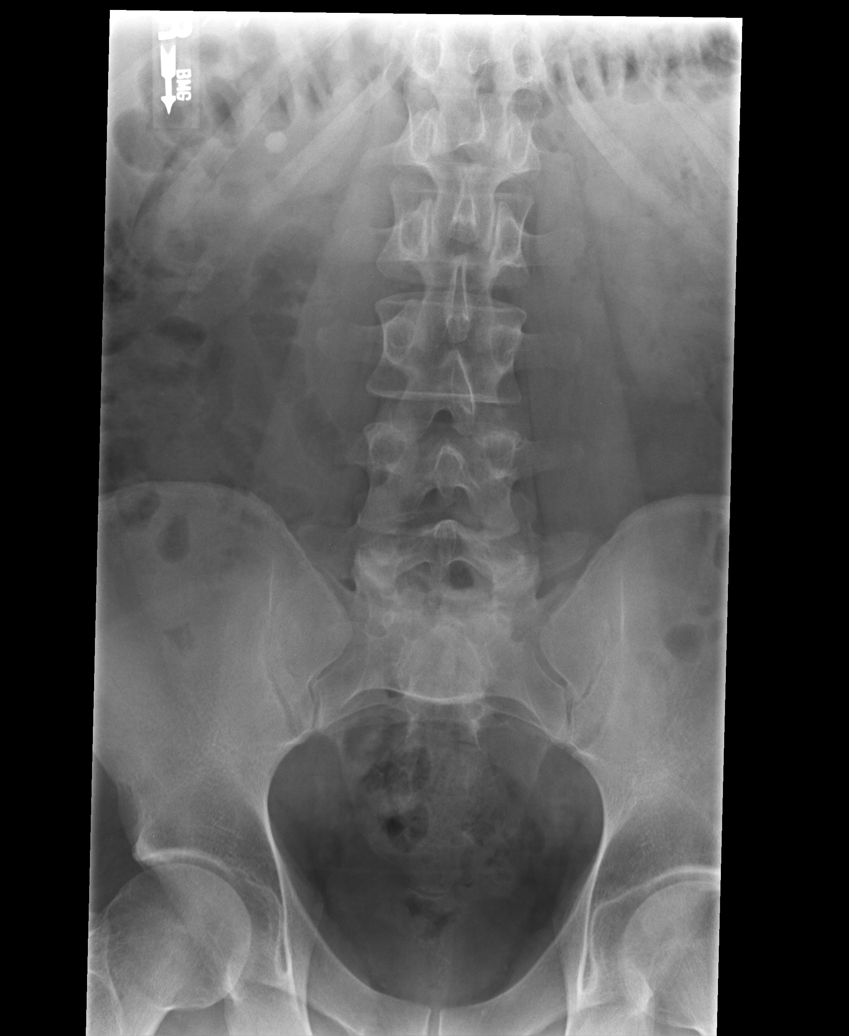

[AP (2 of 2)]
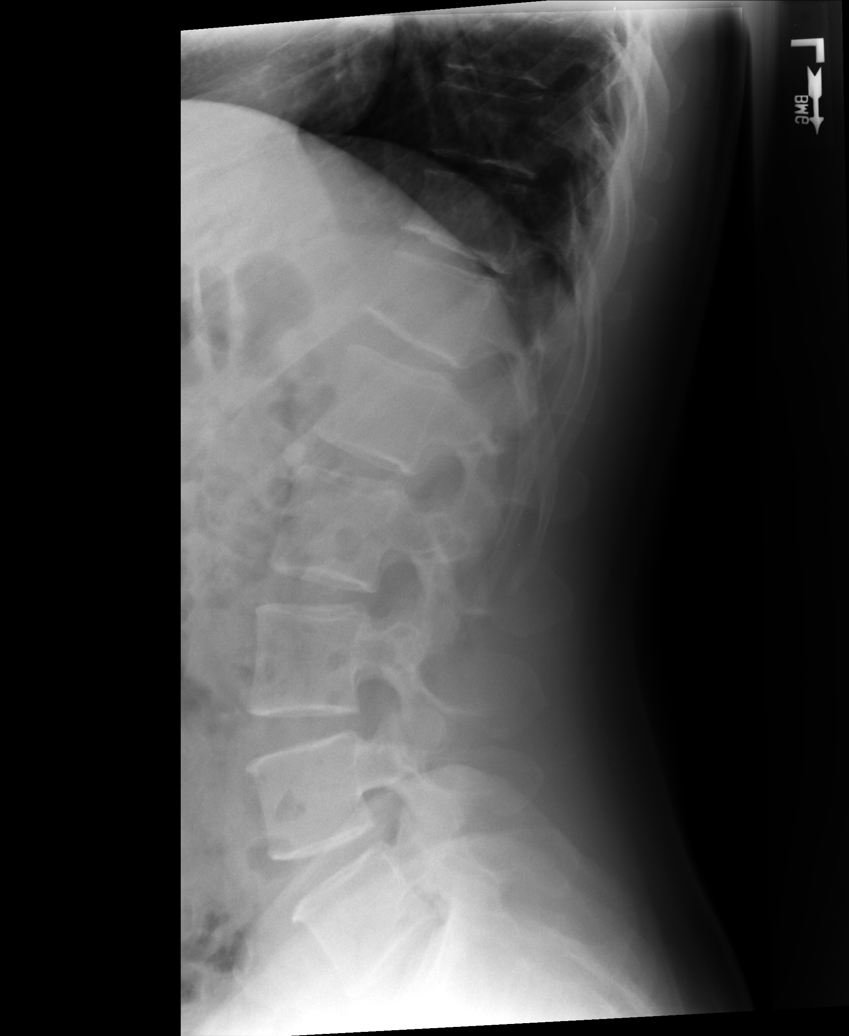

[l5 s1]
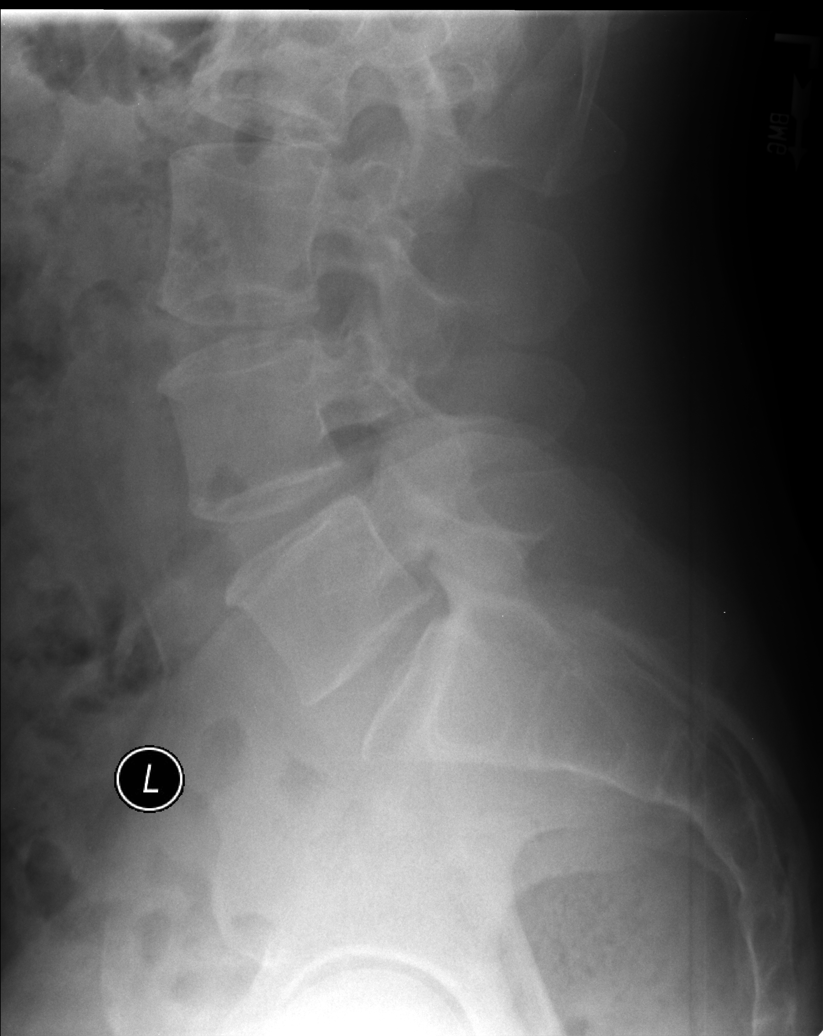

[rpo]
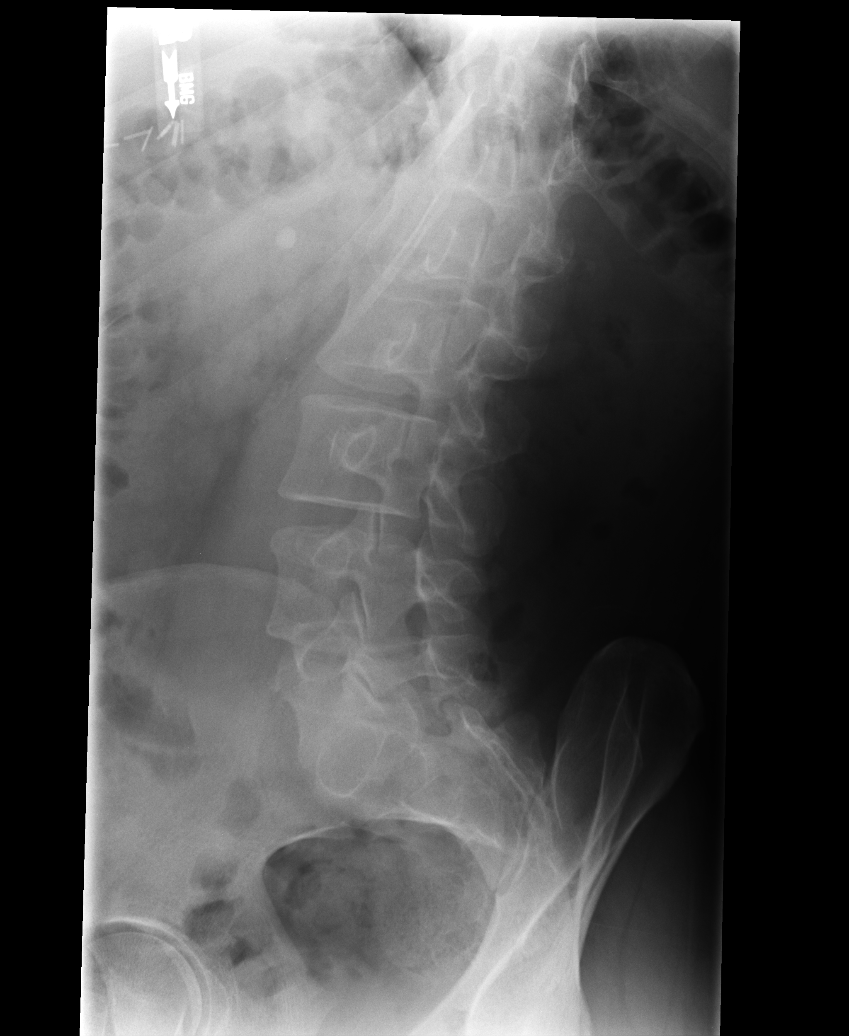

[lpo]
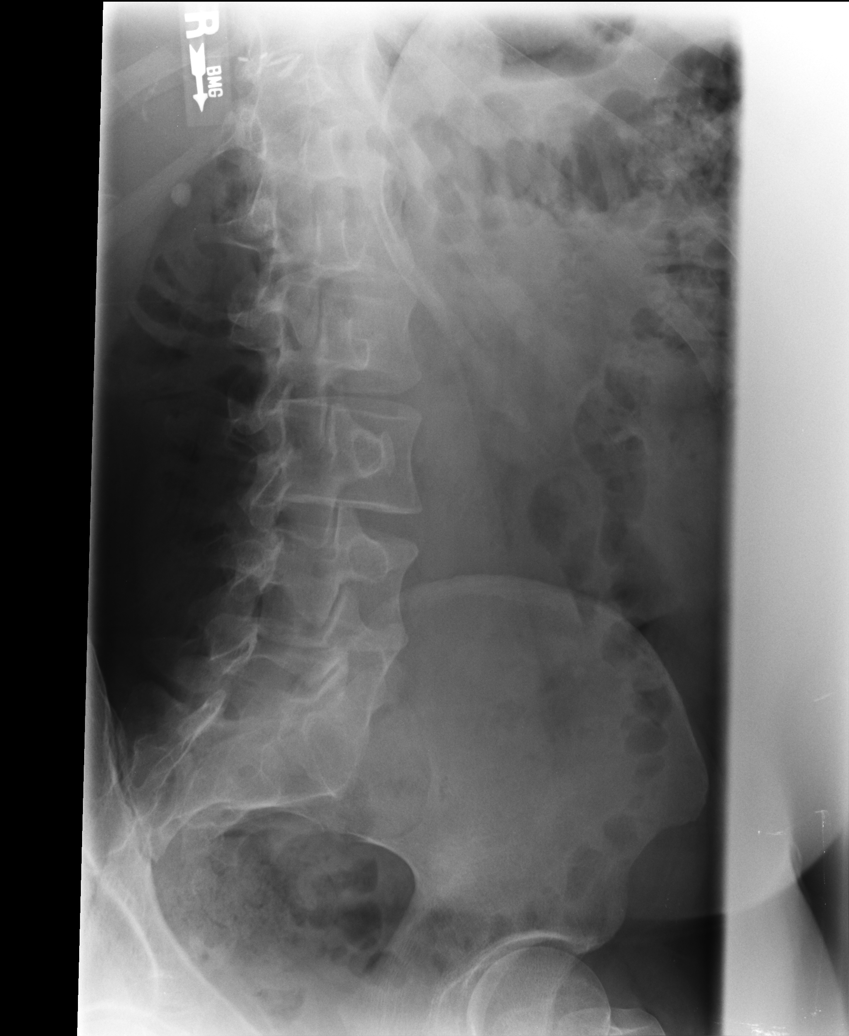

[5 of 5 positions shown; findings below may reference images not displayed]

FINDINGS: Five lumbar type vertebral bodies are well visualized. Vertebral
body height is well maintained. Mild disc space narrowing is noted
at L4-5. No spondylolysis or spondylolisthesis is noted. There is a
rounded 9 mm density overlying the superior aspect of the right
kidney. This was previously described on ultrasound examination from
0533. No other focal abnormality is seen.
IMPRESSION: Mild degenerative change.

Stable right renal stone

## 2016-02-28 ENCOUNTER — Encounter: Payer: Self-pay | Admitting: Family

## 2016-02-28 ENCOUNTER — Ambulatory Visit (INDEPENDENT_AMBULATORY_CARE_PROVIDER_SITE_OTHER): Payer: 59 | Admitting: Family

## 2016-02-28 VITALS — BP 122/84 | HR 70 | Temp 98.1°F | Resp 16 | Ht 70.0 in | Wt 219.0 lb

## 2016-02-28 DIAGNOSIS — R0989 Other specified symptoms and signs involving the circulatory and respiratory systems: Secondary | ICD-10-CM

## 2016-02-28 DIAGNOSIS — Z23 Encounter for immunization: Secondary | ICD-10-CM

## 2016-02-28 DIAGNOSIS — E291 Testicular hypofunction: Secondary | ICD-10-CM | POA: Diagnosis not present

## 2016-02-28 DIAGNOSIS — F458 Other somatoform disorders: Secondary | ICD-10-CM

## 2016-02-28 DIAGNOSIS — R09A2 Foreign body sensation, throat: Secondary | ICD-10-CM

## 2016-02-28 DIAGNOSIS — R0683 Snoring: Secondary | ICD-10-CM

## 2016-02-28 NOTE — Assessment & Plan Note (Signed)
Appears stable with current regimen and improved with weekly basis of injections of 100 mg. No adverse side effects. Most recent hematocrit was below 54% with testosterone level 555. Continue current dosage of testosterone. Follow-up in 2 months for additional blood work.

## 2016-02-28 NOTE — Assessment & Plan Note (Signed)
Globus sensation with concern for possible esophageal narrowing or stricture. Refer to gastroenterology for further assessment and treatment.

## 2016-02-28 NOTE — Patient Instructions (Signed)
Thank you for choosing [facility].  SUMMARY AND INSTRUCTIONS:  Medication:  Please continue taking medications as prescribed. Your testosterone prescription was updated.  Your prescription(s) have been submitted to your pharmacy or been printed and provided for you. Please take as directed and contact our office if you believe you are having problem(s) with the medication(s) or have any questions.   Referrals:  They'll call with referrals to ear nose and throat and gastroenterology.  Referrals have been made during this visit. You should expect to hear back from our schedulers in about 7-10 days in regards to establishing an appointment with the specialists we discussed.   Follow up:  If your symptoms worsen or fail to improve, please contact our office for further instruction, or in case of emergency go directly to the emergency room at the closest medical facility.

## 2016-02-28 NOTE — Progress Notes (Signed)
Subjective:    Patient ID: Matthew Norwayonald L Schuyler Jr., male    DOB: 10/06/1980, 35 y.o.   MRN: [id number]  Chief Complaint  Patient presents with  . Snoring    Started snoring more since gaining some weight, sister and mom had to have esophagus stretched, usually has trouble swallowing food, testosterone rx changed     HPI:  Matthew NorwayRonald L Braver Jr. is a 35 y.o. male who  has a past medical history of Chicken pox; Erectile dysfunction; Heart murmur; and Kidney stones. and presents today for a follow up office visit.  1.) Snorning - This is a new problem. Associated symptom of snorning has been going on for several years and has progressively worsened recently with some increased weight gain. Modifying factors include Breathe-rite which did not help very much. Does not feel rested after he sleeps. Does have daytime sleepiness. If he sits still for any point of time he gets sleepy. Has not been told that he stops breathing.   2.) Trouble swallowing - This is a new problem. Associated symptoms of globus sensation occurs and feels like things are getting stuck in his throat. It has reached the point that he has vomited. Modifying factors include eating slow which has helped some. Describes feelings like it stops which is unrelieved through consumption of liquids.   3.) Low testosterone - Currently maintained on testosterone. Reports taking the medication as prescribed and denies adverse side effects. Notes that his energy is increased with the change of 200 mg weekly.   Allergies  Allergen Reactions  . Penicillins Other (See Comments)    Unknown (as infant)      Outpatient Medications Prior to Visit  Medication Sig Dispense Refill  . testosterone enanthate (DELATESTRYL) 200 MG/ML injection INJECT 0.5MLS INTO MUSCLE EVERY 14 DAYS (Patient taking differently: INJECT 0.5MLS INTO MUSCLE EVERY 7 days) 5 mL 0  . chlorpheniramine-HYDROcodone (TUSSIONEX PENNKINETIC ER) 10-8 MG/5ML SUER Take 5 mLs by  mouth at bedtime as needed. 115 mL 0  . clindamycin (CLEOCIN) 300 MG capsule Take 1 capsule (300 mg total) by mouth 3 (three) times daily. 21 capsule 0  . azithromycin (ZITHROMAX) 250 MG tablet Take 2 tablets by mouth for 1 day and then 1 tablet by mouth daily for 4 days. 6 tablet 0   No facility-administered medications prior to visit.       Past Surgical History:  Procedure Laterality Date  . CHOLECYSTECTOMY  12/09/2011   Procedure: LAPAROSCOPIC CHOLECYSTECTOMY;  Surgeon: Atilano InaEric M Wilson, MD,FACS;  Location: [facility];  Service: General;  Laterality: N/A;  . CLAVICLE SURGERY    . HERNIA REPAIR    . SHOULDER SURGERY        Past Medical History:  Diagnosis Date  . Chicken pox   . Erectile dysfunction   . Heart murmur   . Kidney stones     Review of Systems  Constitutional: Positive for fatigue. Negative for chills and fever.  HENT: Positive for trouble swallowing.   Eyes:       Negative for changes in vision  Respiratory: Negative for cough, chest tightness and wheezing.   Cardiovascular: Negative for chest pain, palpitations and leg swelling.  Neurological: Negative for dizziness, weakness and light-headedness.      Objective:    BP 122/84 (BP Location: Left Arm, Patient Position: Sitting, Cuff Size: Large)   Pulse 70   Temp 98.1 F (36.7 C) (Oral)   Resp 16   Ht 5\' 10"  (1.778 m)  Wt 219 lb (99.3 kg)   SpO2 98%   BMI 31.42 kg/m  Nursing note and vital signs reviewed.  Physical Exam  Constitutional: He is oriented to person, place, and time. He appears well-developed and well-nourished. No distress.  HENT:  Nose: Septal deviation present.  Cardiovascular: Normal rate, regular rhythm, normal heart sounds and intact distal pulses.   Pulmonary/Chest: Effort normal and breath sounds normal.  Neurological: He is alert and oriented to person, place, and time.  Skin: Skin is warm and dry.  Psychiatric: He has a normal mood and affect. His behavior is normal. Judgment  and thought content normal.       Assessment & Plan:   Problem List Items Addressed This Visit      Endocrine   Hypogonadism in male    Appears stable with current regimen and improved with weekly basis of injections of 100 mg. No adverse side effects. Most recent hematocrit was below 54% with testosterone level 555. Continue current dosage of testosterone. Follow-up in 2 months for additional blood work.        Other   Globus sensation - Primary    Globus sensation with concern for possible esophageal narrowing or stricture. Refer to gastroenterology for further assessment and treatment.      Relevant Orders   Ambulatory referral to Gastroenterology   Snoring    Snoring with concern for her deviated septum and sleep apnea. Refer to ear nose and throat for further assessment of deviated septum. If no intervention is needed consider referral to pulmonology/sleep medicine for sleep apnea testing.      Relevant Orders   Ambulatory referral to ENT    Other Visit Diagnoses    Encounter for immunization       Relevant Orders   Flu Vaccine QUAD 36+ mos IM (Completed)       I have discontinued Mr. Rhoda's azithromycin, chlorpheniramine-HYDROcodone, and clindamycin. I am also having him maintain his testosterone enanthate.    Follow-up: Return in about 3 months (around 05/30/2016), or if symptoms worsen or fail to improve.  Jeanine Luz, FNP

## 2016-02-28 NOTE — Assessment & Plan Note (Signed)
Snoring with concern for her deviated septum and sleep apnea. Refer to ear nose and throat for further assessment of deviated septum. If no intervention is needed consider referral to pulmonology/sleep medicine for sleep apnea testing.

## 2016-03-11 ENCOUNTER — Ambulatory Visit (INDEPENDENT_AMBULATORY_CARE_PROVIDER_SITE_OTHER): Payer: 59 | Admitting: Gastroenterology

## 2016-03-11 ENCOUNTER — Encounter: Payer: Self-pay | Admitting: Gastroenterology

## 2016-03-11 VITALS — BP 110/76 | HR 66 | Ht 69.5 in | Wt 218.0 lb

## 2016-03-11 DIAGNOSIS — R1310 Dysphagia, unspecified: Secondary | ICD-10-CM | POA: Diagnosis not present

## 2016-03-11 MED ORDER — OMEPRAZOLE 40 MG PO CPDR: 40.0000 mg | DELAYED_RELEASE_CAPSULE | Freq: Every day | ORAL | 3 refills | Status: DC

## 2016-03-11 NOTE — Patient Instructions (Signed)
Cut back on Dr. Alcus DadPeppers by 1/2 (too much calories and too much caffeine). You will be set up for an upper endoscopy for dysphagia. Omeprazole 40mg  one pill before dinner nightly.

## 2016-03-11 NOTE — Progress Notes (Signed)
HPI: This is a  very pleasant 35 year old man      who was referred to me by Veryl Speak, FNP  to evaluate  dysphasia .    Chief complaint is dysphagia  His wife is with him today.  Breads, meats tend to hang in mid to lower esophagus.  This has been going 2-3 years or so.  Sometimes will lodge so significantly that he will have to vomit to dislodge it.  Twice per week.  Overall stable weight.  Used to have "acid reflux" but no issues in many years. Not on anymeds for it.  Previously nocturnal GERD.  Never had EGD.  He does not drink alcohol or smoke cigarettes. He drinks 6  20 ounce Dr. peppers daily   Review of systems: Pertinent positive and negative review of systems were noted in the above HPI section. Complete review of systems was performed and was otherwise normal.   Past Medical History:  Diagnosis Date  . Chicken pox   . Erectile dysfunction   . Heart murmur   . Kidney stones     Past Surgical History:  Procedure Laterality Date  . CHOLECYSTECTOMY  12/09/2011   Procedure: LAPAROSCOPIC CHOLECYSTECTOMY;  Surgeon: Atilano Ina, MD,FACS;  Location: [facility];  Service: General;  Laterality: N/A;  . CLAVICLE SURGERY    . HERNIA REPAIR    . SHOULDER SURGERY      Current Outpatient Prescriptions  Medication Sig Dispense Refill  . testosterone enanthate (DELATESTRYL) 200 MG/ML injection INJECT 0.5MLS INTO MUSCLE EVERY 14 DAYS (Patient taking differently: INJECT 0.5MLS INTO MUSCLE EVERY 7 days) 5 mL 0   No current facility-administered medications for this visit.     Allergies as of 03/11/2016 - Review Complete 03/11/2016  Allergen Reaction Noted  . Penicillins Other (See Comments) 05/03/2011    Family History  Problem Relation Age of Onset  . Arthritis Mother   . Thyroid disease Mother   . Fibromyalgia Mother   . Other Mother     Bone Degenerative  . Heart attack Father   . Heart disease Maternal Grandmother   . Stroke Paternal Grandmother   .  Healthy Paternal Grandfather   . Thyroid disease Sister   . Thyroid disease Sister   . Lupus Sister   . Anemia Sister   . Diabetes Neg Hx     Social History   Social History  . Marital status: Married    Spouse name: Armando Reichert  . Number of children: 2  . Years of education: 12   Occupational History  . Psychologist, counselling    Social History Main Topics  . Smoking status: Former Smoker    Packs/day: 1.00    Years: 15.00    Types: Cigarettes    Quit date: 08/16/2012  . Smokeless tobacco: Never Used  . Alcohol use Yes     Comment: socially  . Drug use: No  . Sexual activity: Yes    Birth control/ protection: None   Other Topics Concern  . Not on file   Social History Narrative   Born and raised in [place], [place]. Currently resides in a house. 1 dog 1 cat. Fun: Tinker with a jeep, hang out with his wife. Denies any religious beliefs that would effect health care.      Physical Exam: BP 110/76   Pulse 66   Ht 5' 9.5" (1.765 m) Comment: measured without shoes  Wt 218 lb (98.9 kg)   BMI 31.73 kg/m  Constitutional: generally  well-appearing Psychiatric: alert and oriented x3 Eyes: extraocular movements intact Mouth: oral pharynx moist, no lesions Neck: supple no lymphadenopathy Cardiovascular: heart regular rate and rhythm Lungs: clear to auscultation bilaterally Abdomen: soft, nontender, nondistended, no obvious ascites, no peritoneal signs, normal bowel sounds Extremities: no lower extremity edema bilaterally Skin: no lesions on visible extremities   Assessment and plan: 35 y.o. male with  Chronic intermittent solid food dysphagia, extreme caffeine intake  I suspect that his dysphagia is related to acid irritation, damage of the esophagus. Perhaps just edema, perhaps stricturing. Less likely is neoplasia or eosinophilic esophagitis.   He is going to start proton pump inhibitor one pill shortly before his dinner meal nightly since he does not eat breakfast  routinely. I also recommended he try to cut back on his Dr. Reino KentPepper intake. It sounds as if he is having about 120 ounces of Dr. Reino KentPepper daily. This is an extreme amount of extra calories and caffeine. Hopefully he will be a little cut back by one half.  Rob Buntinganiel Cipriano Millikan, MD [facility] 03/11/2016, 11:32 AM  Cc: Veryl Speakalone, Gregory D, FNP

## 2016-03-26 ENCOUNTER — Other Ambulatory Visit: Payer: Self-pay | Admitting: Family

## 2016-03-30 MED ORDER — TESTOSTERONE ENANTHATE 200 MG/ML IM SOLN: INTRAMUSCULAR | 1 refills | Status: DC

## 2016-05-22 ENCOUNTER — Encounter: Payer: 59 | Admitting: Gastroenterology

## 2016-05-22 ENCOUNTER — Encounter: Payer: Self-pay | Admitting: Gastroenterology

## 2016-05-22 ENCOUNTER — Ambulatory Visit (AMBULATORY_SURGERY_CENTER): Payer: 59 | Admitting: Gastroenterology

## 2016-05-22 VITALS — BP 110/77 | HR 67 | Temp 99.6°F | Resp 17 | Ht 69.5 in | Wt 218.0 lb

## 2016-05-22 DIAGNOSIS — K200 Eosinophilic esophagitis: Secondary | ICD-10-CM | POA: Diagnosis not present

## 2016-05-22 DIAGNOSIS — K449 Diaphragmatic hernia without obstruction or gangrene: Secondary | ICD-10-CM

## 2016-05-22 DIAGNOSIS — R1310 Dysphagia, unspecified: Secondary | ICD-10-CM | POA: Diagnosis present

## 2016-05-22 DIAGNOSIS — K222 Esophageal obstruction: Secondary | ICD-10-CM | POA: Diagnosis not present

## 2016-05-22 MED ORDER — SODIUM CHLORIDE 0.9 % IV SOLN: 500.0000 mL | INTRAVENOUS | Status: DC

## 2016-05-22 NOTE — Progress Notes (Signed)
Called to room to assist during endoscopic procedure.  Patient ID and intended procedure confirmed with present staff. Received instructions for my participation in the procedure from the performing physician.  

## 2016-05-22 NOTE — Progress Notes (Signed)
To recovery, report to Scott, RN, VSS 

## 2016-05-22 NOTE — Op Note (Signed)
[facility] Patient Name: Matthew Garcia Procedure Date: 05/22/2016 9:32 AM MRN: [id number] Endoscopist: Rachael Fee , MD Age: 36 Referring MD:  Date of Birth: [birth year] Gender: Male Account #: [id number] Procedure:                Upper GI endoscopy Indications:              Dysphagia, did not improve with PPI for 2-3 weeks                            once daily Medicines:                Monitored Anesthesia Care Procedure:                Pre-Anesthesia Assessment:                           - Prior to the procedure, a History and Physical                            was performed, and patient medications and                            allergies were reviewed. The patient's tolerance of                            previous anesthesia was also reviewed. The risks                            and benefits of the procedure and the sedation                            options and risks were discussed with the patient.                            All questions were answered, and informed consent                            was obtained. Prior Anticoagulants: The patient has                            taken no previous anticoagulant or antiplatelet                            agents. ASA Grade Assessment: II - A patient with                            mild systemic disease. After reviewing the risks                            and benefits, the patient was deemed in                            satisfactory condition to undergo the procedure.  After obtaining informed consent, the endoscope was                            passed under direct vision. Throughout the                            procedure, the patient's blood pressure, pulse, and                            oxygen saturations were monitored continuously. The                            Model GIF-[id number]) scope was introduced                            through the mouth, and advanced to the second  part                            of duodenum. The upper GI endoscopy was                            accomplished without difficulty. The patient                            tolerated the procedure well. Scope In: Scope Out: Findings:                 Mucosal changes including ringed esophagus and                            longitudinal furrows were found in the entire                            esophagus. Biopsies were obtained from the proximal                            and distal esophagus with cold forceps for                            histology of suspected eosinophilic esophagitis.                           One moderate benign-appearing, intrinsic stenosis                            was found at the gastroesophageal junction. This                            measured 1.2 cm (inner diameter). A TTS dilator was                            passed through the scope. Dilation with a 16-17-18                            mm balloon  dilator was performed to 16 mm.                            Following dilation there was the typical minor                            mucosal bleeding, self limited.                           A small hiatal hernia was present.                           The exam was otherwise without abnormality. Complications:            No immediate complications. Estimated blood loss:                            None. Estimated Blood Loss:     Estimated blood loss: none. Impression:               - Esophageal mucosal changes suggestive of                            eosinophilic esophagitis. Biopsies were taken from                            distal and proximal esophagus.                           - Benign-appearing esophageal stenosis. Dilated                            with CRE TTS balloon to 16mm.                           - Small hiatal hernia.                           - The examination was otherwise normal. Recommendation:           - Patient has a contact number available  for                            emergencies. The signs and symptoms of potential                            delayed complications were discussed with the                            patient. Return to normal activities tomorrow.                            Written discharge instructions were provided to the                            patient.                           -  Resume previous diet.                           - Continue present medications.                           - Await pathology results. Pending these results I                            may recommend twice daily PPI and repeat EGD in 2-3                            months to try to establish if you truly have                            Eosinophilic Esophagitis. Rachael Fee, MD 05/22/2016 10:01:12 AM This report has been signed electronically.

## 2016-05-22 NOTE — Patient Instructions (Signed)
YOU HAD AN ENDOSCOPIC PROCEDURE TODAY AT THE [facility]:   Refer to the procedure report that was given to you for any specific questions about what was found during the examination.  If the procedure report does not answer your questions, please call your gastroenterologist to clarify.  If you requested that your care partner not be given the details of your procedure findings, then the procedure report has been included in a sealed envelope for you to review at your convenience later.  YOU SHOULD EXPECT: Some feelings of bloating in the abdomen. Passage of more gas than usual.  Walking can help get rid of the air that was put into your GI tract during the procedure and reduce the bloating. If you had a lower endoscopy (such as a colonoscopy or flexible sigmoidoscopy) you may notice spotting of blood in your stool or on the toilet paper. If you underwent a bowel prep for your procedure, you may not have a normal bowel movement for a few days.  Please Note:  You might notice some irritation and congestion in your nose or some drainage.  This is from the oxygen used during your procedure.  There is no need for concern and it should clear up in a day or so.  SYMPTOMS TO REPORT IMMEDIATELY:   Following lower endoscopy (colonoscopy or flexible sigmoidoscopy):  Excessive amounts of blood in the stool  Significant tenderness or worsening of abdominal pains  Swelling of the abdomen that is new, acute  Fever of 100F or higher   Following upper endoscopy (EGD)  Vomiting of blood or coffee ground material  New chest pain or pain under the shoulder blades  Painful or persistently difficult swallowing  New shortness of breath  Fever of 100F or higher  Black, tarry-looking stools  For urgent or emergent issues, a gastroenterologist can be reached at any hour by calling [phone].   DIET:  We do recommend a small meal at first, but then you may proceed to your regular diet.  Drink  plenty of fluids but you should avoid alcoholic beverages for 24 hours.  ACTIVITY:  You should plan to take it easy for the rest of today and you should NOT DRIVE or use heavy machinery until tomorrow (because of the sedation medicines used during the test).    FOLLOW UP: Our staff will call the number listed on your records the next business day following your procedure to check on you and address any questions or concerns that you may have regarding the information given to you following your procedure. If we do not reach you, we will leave a message.  However, if you are feeling well and you are not experiencing any problems, there is no need to return our call.  We will assume that you have returned to your regular daily activities without incident.  If any biopsies were taken you will be contacted by phone or by letter within the next 1-3 weeks.  Please call us at [phone] if you have not heard about the biopsies in 3 weeks.    SIGNATURES/CONFIDENTIALITY: You and/or your care partner have signed paperwork which will be entered into your electronic medical record.  These signatures attest to the fact that that the information above on your After Visit Summary has been reviewed and is understood.  Full responsibility of the confidentiality of this discharge information lies with you and/or your care-partner.  Await biopsy results.  After dilation diet given with instructions.

## 2016-05-25 ENCOUNTER — Telehealth: Payer: Self-pay

## 2016-05-25 NOTE — Telephone Encounter (Signed)
  Follow up Call-  Call back number 05/22/2016  Post procedure Call Back phone  # [phone]  Permission to leave phone message Yes  Some recent data might be hidden     Patient questions:  Do you have a fever, pain , or abdominal swelling? No. Pain Score  0 *  Have you tolerated food without any problems? Yes.    Have you been able to return to your normal activities? Yes.    Do you have any questions about your discharge instructions: Diet   No. Medications  No. Follow up visit  No.  Do you have questions or concerns about your Care? No.  Actions: * If pain score is 4 or above: No action needed, pain <4.

## 2016-06-02 ENCOUNTER — Telehealth: Payer: Self-pay

## 2016-06-02 NOTE — Telephone Encounter (Signed)
-----   Message from Matthew Garcia, Matthew Garcia ----- Regarding: RE: path results Clydie BraunKaren, The path never came to my inbox.  Looks like it is under a different Rob Buntinganiel Garcia provider name.  This is cut, pasted from the path report. Can you have Dorene GrebeNatalie or French Anaracy look into this:  Authorizing Provider Information   Name: Matthew SanteeDaniel Alan Jacobs, Matthew Garcia Fax: [phone] Phone: [phone] Pager:   Alexia FreestonePatty, Please call him, apologize about the delay but [facility] to me.  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  He needs new script for BID PPI< 1 pill twice daily shortly before BF and dinner and then repeat EGD in 2 months.    Can you also please change this to a phone note for permanent records.  Thanks   ----- Message ----- From: Lamar BlinksKaren M Tyrrell, RN Sent: 06/02/2016   1:59 PM To: Matthew Feeaniel P Jacobs, Matthew Garcia Subject: path results                                   Hi Dr. Christella HartiganJacobs, Path results are back. Path letter? Thanks, Clydie BraunKaren

## 2016-06-08 NOTE — Telephone Encounter (Signed)
Pt has been notified and will increase omeprazole to twice daily for 2 months and recall EGD in EPIC.

## 2016-06-12 ENCOUNTER — Encounter: Payer: Self-pay | Admitting: Gastroenterology

## 2016-06-16 ENCOUNTER — Other Ambulatory Visit: Payer: Self-pay | Admitting: Family

## 2016-06-16 DIAGNOSIS — Z5181 Encounter for therapeutic drug level monitoring: Secondary | ICD-10-CM

## 2016-06-16 DIAGNOSIS — E291 Testicular hypofunction: Secondary | ICD-10-CM

## 2016-06-17 ENCOUNTER — Encounter: Payer: Self-pay | Admitting: Family

## 2016-06-17 NOTE — Telephone Encounter (Signed)
Last refill was 03/30/16 

## 2016-06-17 NOTE — Telephone Encounter (Signed)
Due for blood work - please have him complete prior to refill of medication. Needs to be before 10am. Fasting not needed.

## 2016-06-18 ENCOUNTER — Encounter: Payer: Self-pay | Admitting: Family

## 2016-06-18 ENCOUNTER — Other Ambulatory Visit (INDEPENDENT_AMBULATORY_CARE_PROVIDER_SITE_OTHER): Payer: 59

## 2016-06-18 DIAGNOSIS — Z5181 Encounter for therapeutic drug level monitoring: Secondary | ICD-10-CM

## 2016-06-18 DIAGNOSIS — E291 Testicular hypofunction: Secondary | ICD-10-CM

## 2016-06-18 LAB — CBC
HCT: 44.4 % (ref 39.0–52.0)
Hemoglobin: 15.7 g/dL (ref 13.0–17.0)
MCHC: 35.4 g/dL (ref 30.0–36.0)
MCV: 85.6 fl (ref 78.0–100.0)
PLATELETS: 177 10*3/uL (ref 150.0–400.0)
RBC: 5.18 Mil/uL (ref 4.22–5.81)
RDW: 13.2 % (ref 11.5–15.5)
WBC: 6.7 10*3/uL (ref 4.0–10.5)

## 2016-06-18 LAB — TESTOSTERONE: Testosterone: 827.74 ng/dL (ref 300.00–890.00)

## 2016-06-18 MED ORDER — TESTOSTERONE ENANTHATE 200 MG/ML IM SOLN: INTRAMUSCULAR | 1 refills | Status: DC

## 2016-06-30 ENCOUNTER — Encounter: Payer: Self-pay | Admitting: Family

## 2016-06-30 DIAGNOSIS — G4710 Hypersomnia, unspecified: Secondary | ICD-10-CM

## 2016-08-12 ENCOUNTER — Institutional Professional Consult (permissible substitution): Payer: 59 | Admitting: Pulmonary Disease

## 2016-09-08 ENCOUNTER — Encounter: Payer: Self-pay | Admitting: Nurse Practitioner

## 2016-09-08 ENCOUNTER — Ambulatory Visit (INDEPENDENT_AMBULATORY_CARE_PROVIDER_SITE_OTHER): Payer: 59 | Admitting: Nurse Practitioner

## 2016-09-08 VITALS — BP 130/88 | HR 88 | Temp 99.1°F | Ht 69.5 in | Wt 219.0 lb

## 2016-09-08 DIAGNOSIS — J069 Acute upper respiratory infection, unspecified: Secondary | ICD-10-CM | POA: Diagnosis not present

## 2016-09-08 DIAGNOSIS — J9801 Acute bronchospasm: Secondary | ICD-10-CM | POA: Diagnosis not present

## 2016-09-08 MED ORDER — GUAIFENESIN-DM 100-10 MG/5ML PO SYRP: 5.0000 mL | ORAL_SOLUTION | ORAL | 0 refills | Status: DC | PRN

## 2016-09-08 MED ORDER — AZITHROMYCIN 250 MG PO TABS: 250.0000 mg | ORAL_TABLET | Freq: Every day | ORAL | 0 refills | Status: DC

## 2016-09-08 MED ORDER — OXYMETAZOLINE HCL 0.05 % NA SOLN: 1.0000 | Freq: Two times a day (BID) | NASAL | 0 refills | Status: DC

## 2016-09-08 MED ORDER — ALBUTEROL SULFATE (2.5 MG/3ML) 0.083% IN NEBU
2.5000 mg | INHALATION_SOLUTION | Freq: Once | RESPIRATORY_TRACT | Status: AC
  Administered 2016-09-08: 2.5 mg via RESPIRATORY_TRACT

## 2016-09-08 MED ORDER — METHYLPREDNISOLONE ACETATE 40 MG/ML IJ SUSP
40.0000 mg | Freq: Once | INTRAMUSCULAR | Status: AC
  Administered 2016-09-08: 40 mg via INTRAMUSCULAR

## 2016-09-08 MED ORDER — FLUTICASONE PROPIONATE 50 MCG/ACT NA SUSP: 2.0000 | Freq: Every day | NASAL | 0 refills | Status: DC

## 2016-09-08 MED ORDER — HYDROCODONE-HOMATROPINE 5-1.5 MG/5ML PO SYRP: 5.0000 mL | ORAL_SOLUTION | Freq: Every evening | ORAL | 0 refills | Status: DC | PRN

## 2016-09-08 NOTE — Progress Notes (Signed)
Pre visit review using our clinic review tool, if applicable. No additional management support is needed unless otherwise documented below in the visit note. 

## 2016-09-08 NOTE — Patient Instructions (Signed)
URI Instructions: Flonase and Afrin use: apply 1spray of afrin in each nare, wait , then apply 2sprays of flonase in each nare. Use both nasal spray consecutively x 3days, then flonase only for at least 14days.  Encourage adequate oral hydration.  Use over-the-counter  "cold" medicines  such as "Tylenol cold" , "Advil cold",  "Mucinex" or" Mucinex D"  for cough and congestion.  Avoid decongestants if you have high blood pressure. Use" Delsym" or" Robitussin" cough syrup varietis for cough.  You can use plain "Tylenol" or "Advi"l for fever, chills and achyness.   "Common cold" symptoms are usually triggered by a virus.  The antibiotics are usually not necessary. On average, a" viral cold" illness would take 4-7 days to resolve. Please, make an appointment if you are not better or if you're worse.   Start azithromycin if no improvement in 3days.

## 2016-09-08 NOTE — Progress Notes (Signed)
Subjective:  Patient ID: Matthew Garcia., male    DOB: November 08, 1980  Age: 36 y.o. MRN: [id number]  CC: Cough (coughing yellow/green mucus,sneezing,congestion,chill, fever for 2 days. took OTC)   Cough  This is a new problem. The current episode started in the past 7 days. The problem has been unchanged. The cough is productive of purulent sputum. Associated symptoms include chills, headaches, myalgias, nasal congestion, postnasal drip, rhinorrhea and a sore throat. Pertinent negatives include no chest pain. The symptoms are aggravated by lying down and pollens. Risk factors for lung disease include travel and smoking/tobacco exposure. He has tried OTC cough suppressant for the symptoms. The treatment provided no relief. His past medical history is significant for environmental allergies.    Outpatient Medications Prior to Visit  Medication Sig Dispense Refill  . omeprazole (PRILOSEC) 40 MG capsule Take 1 capsule (40 mg total) by mouth daily. Before dinner 90 capsule 3  . testosterone enanthate (DELATESTRYL) 200 MG/ML injection INJECT 0.5MLS INTO MUSCLE EVERY 7 days 5 mL 1   Facility-Administered Medications Prior to Visit  Medication Dose Route Frequency Provider Last Rate Last Dose  . 0.9 %  sodium chloride infusion  500 mL Intravenous Continuous Rachael Fee, MD        ROS See HPI  Objective:  BP 130/88   Pulse 88   Temp 99.1 F (37.3 C)   Ht 5' 9.5" (1.765 m)   Wt 219 lb (99.3 kg)   SpO2 100%   BMI 31.88 kg/m   BP Readings from Last 3 Encounters:  09/08/16 130/88  05/22/16 110/77  03/11/16 110/76    Wt Readings from Last 3 Encounters:  09/08/16 219 lb (99.3 kg)  05/22/16 218 lb (98.9 kg)  03/11/16 218 lb (98.9 kg)    Physical Exam  Constitutional: He is oriented to person, place, and time. No distress.  HENT:  Right Ear: Tympanic membrane, external ear and ear canal normal.  Left Ear: Tympanic membrane and ear canal normal.  Nose: Mucosal edema and  rhinorrhea present. Right sinus exhibits maxillary sinus tenderness and frontal sinus tenderness. Left sinus exhibits maxillary sinus tenderness and frontal sinus tenderness.  Mouth/Throat: Uvula is midline. Posterior oropharyngeal erythema present. No oropharyngeal exudate.  Eyes: No scleral icterus.  Neck: Normal range of motion. Neck supple.  Cardiovascular: Normal rate and regular rhythm.   Pulmonary/Chest: Effort normal. He has wheezes. He has no rales.  Lymphadenopathy:    He has cervical adenopathy.  Neurological: He is alert and oriented to person, place, and time.  Vitals reviewed.   Lab Results  Component Value Date   WBC 6.7 06/18/2016   HGB 15.7 06/18/2016   HCT 44.4 06/18/2016   PLT 177.0 06/18/2016   GLUCOSE 104 (H) 07/06/2014   CHOL 134 08/26/2012   TRIG 83 08/26/2012   HDL 34 (L) 08/26/2012   LDLCALC 83 08/26/2012   ALT 19 08/26/2012   AST 20 08/26/2012   NA 140 07/06/2014   K 4.2 07/06/2014   CL 103 07/06/2014   CREATININE 0.93 07/06/2014   BUN 8 07/06/2014   CO2 31 07/06/2014   TSH 2.85 07/06/2014    Mr Card Morphology Wo/w Cm  Result Date: 02/28/2013 CLINICAL DATA:  R/O RV Dysplasia Family history of RV dysplasia Palpitations EXAM: Cardiac MRI TECHNIQUE: The patient was scanned on a 1.5 Tesla GE magnet. A dedicated cardiac coil was used. Functional imaging was done using Fiesta sequences. 2,3, and 4 chamber views were done to assess  for RWMA;s. Modified Simpson's rule using a short axis stack was used to calculate an ejection fraction on a dedicated work [employer]. The RV was assessed in the axial and multiple oblique planes. Matched IIR and triple IR with fat sat sequences were used to assess for fatty infiltration of the RV The patient received 25cc of Multihance. After 10 minutes inversion recovery sequences were used to assess for infiltration and scar tissue. The patient was scanned on a 1.5 Tesla GE magnet. A dedicated cardiac coil was  used. Functional imaging was done using Fiesta sequences. 2,3, and 4 chamber views were done to assess for RWMA's. Modified Simpson's rule using a short axis stack was used to calculate an ejection fraction on a dedicated work [employer], [employer]. The patient received 25 cc of Multihance. After 10 minutes inversion recovery sequences were used to assess for infiltration and scar tissue. The RV was assessed in axial, short axis and sagittal planes using IIR and IIR with fat sat sequences CONTRAST:  Multihane FINDINGS: All 4 cardiac chambers were normal in size and function. There was no ASD/VSD or pericardial effusion. Mitral, Aortic valve and tricuspid valve appeared normal. The pulmonary veins drained normall into the LA. The quantitative EF was 60% (EDV 134cc, ESV 54 cc and SV 80cc) There was no delayed enhancement or scar tissue in the LV on inversion recovery sequences. The RV had prominant trabeculations but no fatty infiltration, diverticulum or evidence of dysplasia IMPRESSION: 1) No evidence of ARVD.  Normal RV size and function 2) Normal LV EF 60% 3) No delayed enhancement in the left ventricular myocardium 4) Normal valves 5) Normal atria 6) No pericardial effusion 7) No ASD/VSD 8) Normal pulmonary veins Charlton Haws Electronically Signed   By: Charlton Haws M.D.   On: 02/28/2013 18:18    Assessment & Plan:   Ever was seen today for cough.  Diagnoses and all orders for this visit:  Upper respiratory tract infection, unspecified type -     fluticasone (FLONASE) 50 MCG/ACT nasal spray; Place 2 sprays into both nostrils daily. -     oxymetazoline (AFRIN NASAL SPRAY) 0.05 % nasal spray; Place 1 spray into both nostrils 2 (two) times daily. Use only for 3days, then stop -     guaiFENesin-dextromethorphan (ROBITUSSIN DM) 100-10 MG/5ML syrup; Take 5 mLs by mouth every 4 (four) hours as needed for cough. -     HYDROcodone-homatropine (HYCODAN) 5-1.5 MG/5ML syrup; Take 5 mLs by mouth at  bedtime as needed for cough. -     azithromycin (ZITHROMAX Z-PAK) 250 MG tablet; Take 1 tablet (250 mg total) by mouth daily. Take 2tabs on first day, then 1tab once a day till complete  Cough due to bronchospasm -     albuterol (PROVENTIL) (2.5 MG/3ML) 0.083% nebulizer solution 2.5 mg; Take 3 mLs (2.5 mg total) by nebulization once. -     guaiFENesin-dextromethorphan (ROBITUSSIN DM) 100-10 MG/5ML syrup; Take 5 mLs by mouth every 4 (four) hours as needed for cough. -     HYDROcodone-homatropine (HYCODAN) 5-1.5 MG/5ML syrup; Take 5 mLs by mouth at bedtime as needed for cough.   I am having Mr. Ernsberger start on fluticasone, oxymetazoline, guaiFENesin-dextromethorphan, HYDROcodone-homatropine, and azithromycin. I am also having him maintain his omeprazole and testosterone enanthate. We administered albuterol. We will continue to administer sodium chloride.  Meds ordered this encounter  Medications  . albuterol (PROVENTIL) (2.5 MG/3ML) 0.083% nebulizer solution 2.5 mg  . fluticasone (FLONASE) 50 MCG/ACT  nasal spray    Sig: Place 2 sprays into both nostrils daily.    Dispense:  16 g    Refill:  0    Order Specific Question:   Supervising Provider    Answer:   Tresa Garter [[id number]]  . oxymetazoline (AFRIN NASAL SPRAY) 0.05 % nasal spray    Sig: Place 1 spray into both nostrils 2 (two) times daily. Use only for 3days, then stop    Dispense:  30 mL    Refill:  0    Order Specific Question:   Supervising Provider    Answer:   Tresa Garter [[id number]]  . guaiFENesin-dextromethorphan (ROBITUSSIN DM) 100-10 MG/5ML syrup    Sig: Take 5 mLs by mouth every 4 (four) hours as needed for cough.    Dispense:  118 mL    Refill:  0    Order Specific Question:   Supervising Provider    Answer:   Tresa Garter [[id number]]  . HYDROcodone-homatropine (HYCODAN) 5-1.5 MG/5ML syrup    Sig: Take 5 mLs by mouth at bedtime as needed for cough.    Dispense:  120 mL    Refill:  0    Order Specific  Question:   Supervising Provider    Answer:   Tresa Garter [[id number]]  . azithromycin (ZITHROMAX Z-PAK) 250 MG tablet    Sig: Take 1 tablet (250 mg total) by mouth daily. Take 2tabs on first day, then 1tab once a day till complete    Dispense:  6 tablet    Refill:  0    Order Specific Question:   Supervising Provider    Answer:   Tresa Garter [[id number]]    Follow-up: Return if symptoms worsen or fail to improve.  Alysia Penna, NP

## 2016-09-17 ENCOUNTER — Other Ambulatory Visit: Payer: Self-pay | Admitting: Family

## 2016-09-21 NOTE — Telephone Encounter (Signed)
Faxed

## 2016-09-30 ENCOUNTER — Ambulatory Visit (INDEPENDENT_AMBULATORY_CARE_PROVIDER_SITE_OTHER): Payer: 59 | Admitting: Pulmonary Disease

## 2016-09-30 ENCOUNTER — Encounter: Payer: Self-pay | Admitting: Pulmonary Disease

## 2016-09-30 VITALS — BP 132/92 | HR 66 | Ht 70.0 in | Wt 214.8 lb

## 2016-09-30 DIAGNOSIS — R0683 Snoring: Secondary | ICD-10-CM

## 2016-09-30 DIAGNOSIS — R29818 Other symptoms and signs involving the nervous system: Secondary | ICD-10-CM

## 2016-09-30 NOTE — Progress Notes (Signed)
   Subjective:    Patient ID: Matthew Norwayonald L Stankowski Jr., male    DOB: 08/28/1980, 36 y.o.   MRN: [id number]  HPI    Review of Systems  Constitutional: Negative for fever and unexpected weight change.  HENT: Negative for dental problem, ear pain, nosebleeds, postnasal drip, rhinorrhea, sinus pressure, sneezing, sore throat and trouble swallowing.   Eyes: Negative for redness and itching.  Respiratory: Negative for cough, chest tightness, shortness of breath and wheezing.   Cardiovascular: Negative for palpitations and leg swelling.  Gastrointestinal: Negative for nausea and vomiting.  Genitourinary: Negative for dysuria.  Musculoskeletal: Negative for joint swelling.  Skin: Negative for rash.  Neurological: Negative for headaches.  Hematological: Does not bruise/bleed easily.  Psychiatric/Behavioral: Negative for dysphoric mood. The patient is not nervous/anxious.        Objective:   Physical Exam        Assessment & Plan:

## 2016-09-30 NOTE — Progress Notes (Signed)
Past Surgical History He  has a past surgical history that includes Shoulder surgery; Hernia repair; Clavicle surgery; and Cholecystectomy (12/09/2011).  Allergies  Allergen Reactions  . Hydrocodone Nausea Only    Hycodan Syrup  . Penicillins Other (See Comments)    Unknown (as infant)    Family History His family history includes Anemia in his sister; Arthritis in his mother; Fibromyalgia in his mother; Healthy in his paternal grandfather; Heart attack in his father; Heart disease in his maternal grandmother; Lupus in his sister; Other in his mother; Stroke in his paternal grandmother; Thyroid disease in his mother, sister, and sister.  Social History He  reports that he quit smoking about 4 years ago. His smoking use included Cigarettes. He has a 15.00 pack-year smoking history. He has never used smokeless tobacco. He reports that he drinks alcohol. He reports that he does not use drugs.  Review of systems Constitutional: Negative for fever and unexpected weight change.  HENT: Negative for dental problem, ear pain, nosebleeds, postnasal drip, rhinorrhea, sinus pressure, sneezing, sore throat and trouble swallowing.   Eyes: Negative for redness and itching.  Respiratory: Negative for cough, chest tightness, shortness of breath and wheezing.   Cardiovascular: Negative for palpitations and leg swelling.  Gastrointestinal: Negative for nausea and vomiting.  Genitourinary: Negative for dysuria.  Musculoskeletal: Negative for joint swelling.  Skin: Negative for rash.  Neurological: Negative for headaches.  Hematological: Does not bruise/bleed easily.  Psychiatric/Behavioral: Negative for dysphoric mood. The patient is not nervous/anxious.     Current Outpatient Prescriptions on File Prior to Visit  Medication Sig  . fluticasone (FLONASE) 50 MCG/ACT nasal spray Place 2 sprays into both nostrils daily.  Marland Kitchen guaiFENesin-dextromethorphan (ROBITUSSIN DM) 100-10 MG/5ML syrup Take 5 mLs by mouth  every 4 (four) hours as needed for cough.  Marland Kitchen omeprazole (PRILOSEC) 40 MG capsule Take 1 capsule (40 mg total) by mouth daily. Before dinner  . oxymetazoline (AFRIN NASAL SPRAY) 0.05 % nasal spray Place 1 spray into both nostrils 2 (two) times daily. Use only for 3days, then stop  . testosterone enanthate (DELATESTRYL) 200 MG/ML injection INJECT 0.5MLS INTO MUSCLE EVERY 7 DAYS   Current Facility-Administered Medications on File Prior to Visit  Medication  . 0.9 %  sodium chloride infusion    Chief Complaint  Patient presents with  . SLEEP CONSULT    Referred by Dr Carver Fila. Epworth Score: 17    Past medical history He  has a past medical history of Chicken pox; Erectile dysfunction; Heart murmur; and Kidney stones.  Vital signs BP (!) 132/92 (BP Location: Left Arm, Cuff Size: Normal)   Pulse 66   Ht 5\' 10"  (1.778 m)   Wt 214 lb 12.8 oz (97.4 kg)   SpO2 98%   BMI 30.82 kg/m   History of Present Illness Matthew Garcia. is a 36 y.o. male for evaluation of sleep problems.  He has been concerned about his snoring, and having trouble breathing while asleep.  This has been going on for years, but has been getting worse.  He will wake up hearing himself snore.  His wife has told him he stops breathing while asleep.  This can happen if he is on his back or side.  He doesn't dream much.  His mouth gets dry at night.    He goes to sleep at 11 pm.  He falls asleep within 15 minutes.  He wakes up some times to use the bathroom.  He gets out of bed  at 6 am.  He feels like he is drunk in the morning.  He denies morning headache.  He does not use anything to help him fall sleep.  He used to drink Red Bull to keep awake.  He has trouble staying alert when driving sometimes.  He can fall asleep anywhere if he is sitting quiet.  He will sometimes gets cramps in his feet at night.  He denies sleep walking, sleep talking, bruxism, or nightmares.  There is no history of restless legs.  He denies  sleep hallucinations, sleep paralysis, or cataplexy.  The Epworth score is 17 out of 24.   Physical Exam:  General - No distress ENT - No sinus tenderness, no oral exudate, no LAN, no thyromegaly, TM clear, pupils equal/reactive, wears dentures, triangular uvula, decreased AP diameter Cardiac - s1s2 regular, no murmur, pulses symmetric Chest - No wheeze/rales/dullness, good air entry, normal respiratory excursion Back - No focal tenderness Abd - Soft, non-tender, no organomegaly, + bowel sounds Ext - No edema Neuro - Normal strength, cranial nerves intact Skin - No rashes Psych - Normal mood, and behavior  Discussion: He has snoring, sleep disruption, daytime sleepiness, and witnessed apnea.  I am concerned he could have sleep apnea.  We discussed how sleep apnea can affect various health problems, including risks for hypertension, cardiovascular disease, and diabetes.  We also discussed how sleep disruption can increase risks for accidents, such as while driving.  Weight loss as a means of improving sleep apnea was also reviewed.  Additional treatment options discussed were CPAP therapy, oral appliance, and surgical intervention.  Assessment/plan:  Snoring with concern for obstructive sleep apnea. - will arrange for home sleep study - if he is found to have sleep apnea, will then arrange for CPAP and would likely benefit from nasal mask or nasal pillow mask given upper airway anatomy   Patient Instructions  Will arrange for home sleep study Will call to arrange for follow up after sleep study reviewed    Coralyn HellingVineet Nalany Steedley, M.D. Pager [phone] 09/30/2016, 11:47 AM

## 2016-09-30 NOTE — Patient Instructions (Signed)
Will arrange for home sleep study Will call to arrange for follow up after sleep study reviewed  

## 2016-10-27 DIAGNOSIS — G4733 Obstructive sleep apnea (adult) (pediatric): Secondary | ICD-10-CM | POA: Diagnosis not present

## 2016-11-03 ENCOUNTER — Encounter: Payer: Self-pay | Admitting: Pulmonary Disease

## 2016-11-03 ENCOUNTER — Telehealth: Payer: Self-pay | Admitting: Pulmonary Disease

## 2016-11-03 DIAGNOSIS — G4733 Obstructive sleep apnea (adult) (pediatric): Secondary | ICD-10-CM | POA: Insufficient documentation

## 2016-11-03 HISTORY — DX: Obstructive sleep apnea (adult) (pediatric): G47.33

## 2016-11-03 NOTE — Telephone Encounter (Signed)
HST 10/27/16 >> AHI 9.7, SaO2 low 90%   Will have my nurse inform pt that sleep study shows mild sleep apnea.  Options are 1) CPAP now, 2) ROV first.  If He is agreeable to CPAP, then please send order for auto CPAP range 5 to 15 cm H2O with heated humidity and mask of choice.  Have download sent 1 month after starting CPAP and set up ROV 2 months after starting CPAP.  ROV with me or NP.

## 2016-11-04 DIAGNOSIS — G4733 Obstructive sleep apnea (adult) (pediatric): Secondary | ICD-10-CM | POA: Diagnosis not present

## 2016-11-04 NOTE — Telephone Encounter (Signed)
Pt calling for results of sleep test and can be reached @ [phone].Matthew GriffinsStanley A Dalton

## 2016-11-04 NOTE — Telephone Encounter (Signed)
Spoke with pt. He is aware of his results. Order has been placed for CPAP therapy. Pt will contact us to make his OV once he receives his CPAP.

## 2016-11-05 ENCOUNTER — Other Ambulatory Visit: Payer: Self-pay | Admitting: *Deleted

## 2016-11-05 DIAGNOSIS — R0683 Snoring: Secondary | ICD-10-CM

## 2016-11-20 DIAGNOSIS — G4733 Obstructive sleep apnea (adult) (pediatric): Secondary | ICD-10-CM | POA: Diagnosis not present

## 2016-12-10 ENCOUNTER — Other Ambulatory Visit: Payer: Self-pay | Admitting: Emergency Medicine

## 2016-12-10 NOTE — Telephone Encounter (Signed)
[place] Controlled Substance Database checked. Last filled on 10/26/16.

## 2016-12-11 MED ORDER — TESTOSTERONE ENANTHATE 200 MG/ML IM SOLN: INTRAMUSCULAR | 1 refills | Status: DC

## 2016-12-11 NOTE — Telephone Encounter (Signed)
[facility] [place] [place]

## 2016-12-15 ENCOUNTER — Encounter: Payer: Self-pay | Admitting: Family

## 2016-12-15 DIAGNOSIS — E291 Testicular hypofunction: Secondary | ICD-10-CM

## 2016-12-18 ENCOUNTER — Other Ambulatory Visit (INDEPENDENT_AMBULATORY_CARE_PROVIDER_SITE_OTHER): Payer: 59

## 2016-12-18 ENCOUNTER — Encounter: Payer: Self-pay | Admitting: Family

## 2016-12-18 DIAGNOSIS — E291 Testicular hypofunction: Secondary | ICD-10-CM | POA: Diagnosis not present

## 2016-12-18 LAB — CBC
HCT: 48.3 % (ref 39.0–52.0)
HEMOGLOBIN: 16.4 g/dL (ref 13.0–17.0)
MCHC: 34.1 g/dL (ref 30.0–36.0)
MCV: 89.6 fl (ref 78.0–100.0)
PLATELETS: 170 10*3/uL (ref 150.0–400.0)
RBC: 5.39 Mil/uL (ref 4.22–5.81)
RDW: 13.3 % (ref 11.5–15.5)
WBC: 5.6 10*3/uL (ref 4.0–10.5)

## 2016-12-18 LAB — TESTOSTERONE: TESTOSTERONE: 690.22 ng/dL (ref 300.00–890.00)

## 2016-12-21 DIAGNOSIS — G4733 Obstructive sleep apnea (adult) (pediatric): Secondary | ICD-10-CM | POA: Diagnosis not present

## 2017-01-21 DIAGNOSIS — G4733 Obstructive sleep apnea (adult) (pediatric): Secondary | ICD-10-CM | POA: Diagnosis not present

## 2017-01-28 ENCOUNTER — Ambulatory Visit
Admission: RE | Admit: 2017-01-28 | Discharge: 2017-01-28 | Disposition: A | Discharge status: Home / Self Care | Payer: 59 | Source: Ambulatory Visit | Attending: Nurse Practitioner | Admitting: Nurse Practitioner

## 2017-01-28 ENCOUNTER — Other Ambulatory Visit: Payer: Self-pay | Admitting: Nurse Practitioner

## 2017-01-28 DIAGNOSIS — S3992XA Unspecified injury of lower back, initial encounter: Secondary | ICD-10-CM | POA: Diagnosis not present

## 2017-01-28 DIAGNOSIS — M545 Low back pain: Secondary | ICD-10-CM

## 2017-02-20 ENCOUNTER — Other Ambulatory Visit: Payer: Self-pay | Admitting: Family

## 2017-02-20 DIAGNOSIS — G4733 Obstructive sleep apnea (adult) (pediatric): Secondary | ICD-10-CM | POA: Diagnosis not present

## 2017-02-23 NOTE — Telephone Encounter (Signed)
Rx faxed

## 2017-02-26 ENCOUNTER — Other Ambulatory Visit: Payer: Self-pay | Admitting: Family

## 2017-02-26 MED ORDER — TESTOSTERONE CYPIONATE 200 MG/ML IM SOLN: 100.0000 mg | INTRAMUSCULAR | 0 refills | Status: DC

## 2017-03-03 ENCOUNTER — Ambulatory Visit (INDEPENDENT_AMBULATORY_CARE_PROVIDER_SITE_OTHER): Payer: 59

## 2017-03-03 DIAGNOSIS — Z23 Encounter for immunization: Secondary | ICD-10-CM

## 2017-03-05 DIAGNOSIS — G4733 Obstructive sleep apnea (adult) (pediatric): Secondary | ICD-10-CM | POA: Diagnosis not present

## 2017-03-10 ENCOUNTER — Encounter: Payer: Self-pay | Admitting: Pulmonary Disease

## 2017-03-10 DIAGNOSIS — G4733 Obstructive sleep apnea (adult) (pediatric): Secondary | ICD-10-CM

## 2017-03-11 NOTE — Telephone Encounter (Signed)
Please send order to have auto CPAP change to 8 to 20 cm H2O.  Please inform pt that his CPAP settings will be adjusted.

## 2017-03-19 ENCOUNTER — Telehealth: Payer: Self-pay | Admitting: Family

## 2017-03-19 MED ORDER — TESTOSTERONE CYPIONATE 200 MG/ML IM SOLN: 100.0000 mg | INTRAMUSCULAR | 0 refills | Status: DC

## 2017-03-19 NOTE — Telephone Encounter (Signed)
Patient requesting testosterone to be sent to [facility] in [place].  States he is switching pharmacies.  [facility]

## 2017-03-19 NOTE — Telephone Encounter (Signed)
Testosterone refill sent to [facility] [place]

## 2017-03-19 NOTE — Telephone Encounter (Signed)
Pls advice on refill pt has appt 04/22/17...Raechel Chute/lmb

## 2017-03-23 DIAGNOSIS — G4733 Obstructive sleep apnea (adult) (pediatric): Secondary | ICD-10-CM | POA: Diagnosis not present

## 2017-04-19 ENCOUNTER — Ambulatory Visit: Payer: 59 | Admitting: Nurse Practitioner

## 2017-04-19 ENCOUNTER — Encounter: Payer: Self-pay | Admitting: Nurse Practitioner

## 2017-04-19 VITALS — BP 132/80 | HR 56 | Temp 98.1°F | Resp 16 | Ht 70.0 in | Wt 197.0 lb

## 2017-04-19 DIAGNOSIS — E291 Testicular hypofunction: Secondary | ICD-10-CM

## 2017-04-19 NOTE — Patient Instructions (Addendum)
Please return for lab work between 8 am and 11am.  Id like to see you back in about 6 months, for follow up of your testosterone, and also for an annual physical at your convenience.  It was nice to meet you. Thanks for letting me take care of you today :)

## 2017-04-19 NOTE — Progress Notes (Signed)
Subjective:    Patient ID: Matthew Norwayonald L Canniff Jr., male    DOB: 05/18/1980, 36 y.o.   MRN: [id number]  HPI Mr. Matthew Garcia is a 36 yo male who presents today to establish care. He is transferring to me from another provider in the same clinic.    Hypogonadism- maintained on testosterone cypionate 0.685mLs every 7 days. He self administers the testosterone at home. He feels his testosterone levels have "remained the same" for some time and hed like to increased his weekly dosage to try to increase his testosterone readings. He reports he feels good at the beginning of the week after he administers the testosterone, but by the end of the week his is "moody and irritable." He attributes this moodiness to his testosterone level decreasing by the end of the week. He reports overall he feels well and tolerates the testosterone well. He has good libido and denies any chest pain, palpitations, acid reflux, abdominal pain, nausea, diarrhea, urinary hesitancy, urinary weakness, dysuria. he's been to urology in the past but felt his testosterone issues were not addressed and would prefer not to go back.   Review of Systems  See HPI  Past Medical History:  Diagnosis Date  . Chicken pox   . Erectile dysfunction   . Heart murmur   . Kidney stones   . OSA (obstructive sleep apnea) 11/03/2016     Social History   Socioeconomic History  . Marital status: Married    Spouse name: Matthew Garcia  . Number of children: 2  . Years of education: 1812  . Highest education level: Not on file  Social Needs  . Financial resource strain: Not on file  . Food insecurity - worry: Not on file  . Food insecurity - inability: Not on file  . Transportation needs - medical: Not on file  . Transportation needs - non-medical: Not on file  Occupational History  . Occupation: Psychologist, counsellingConstruction Inspector  Tobacco Use  . Smoking status: Former Smoker    Packs/day: 1.00    Years: 15.00    Pack years: 15.00    Types: Cigarettes    Last  attempt to quit: 08/16/2012    Years since quitting: 4.6  . Smokeless tobacco: Never Used  Substance and Sexual Activity  . Alcohol use: Yes    Comment: socially  . Drug use: No  . Sexual activity: Yes    Birth control/protection: None  Other Topics Concern  . Not on file  Social History Narrative   Born [place] raised in [place], [place]. Currently resides in a house. 1 dog 1 cat. Fun: Tinker with a jeep, hang out with his wife. Denies any religious beliefs that would effect health care.     Past Surgical History:  Procedure Laterality Date  . CHOLECYSTECTOMY  12/09/2011   Procedure: LAPAROSCOPIC CHOLECYSTECTOMY;  Surgeon: Atilano InaEric M Wilson, MD,FACS;  Location: [facility];  Service: General;  Laterality: N/A;  . CLAVICLE SURGERY    . HERNIA REPAIR    . SHOULDER SURGERY      Family History  Problem Relation Age of Onset  . Arthritis Mother   . Thyroid disease Mother   . Fibromyalgia Mother   . Other Mother        Bone Degenerative  . Heart attack Father   . Heart disease Maternal Grandmother   . Stroke Paternal Grandmother   . Healthy Paternal Grandfather   . Thyroid disease Sister   . Thyroid disease Sister   . Lupus Sister   .  Anemia Sister   . Diabetes Neg Hx   . Colon cancer Neg Hx   . Colon polyps Neg Hx   . Esophageal cancer Neg Hx   . Stomach cancer Neg Hx   . Rectal cancer Neg Hx     Allergies  Allergen Reactions  . Hydrocodone Nausea Only    Hycodan Syrup  . Penicillins Other (See Comments)    Unknown (as infant)    Current Outpatient Medications on File Prior to Visit  Medication Sig Dispense Refill  . omeprazole (PRILOSEC) 40 MG capsule Take 1 capsule (40 mg total) by mouth daily. Before dinner 90 capsule 3  . testosterone cypionate (DEPOTESTOSTERONE CYPIONATE) 200 MG/ML injection Inject 0.5 mLs (100 mg total) into the muscle every 7 (seven) days. 10 mL 0   Current Facility-Administered Medications on File Prior to Visit  Medication Dose Route Frequency Provider  Last Rate Last Dose  . 0.9 %  sodium chloride infusion  500 mL Intravenous Continuous Rachael FeeJacobs, Daniel P, MD        BP 132/80 (BP Location: Left Arm, Patient Position: Sitting, Cuff Size: Large)   Pulse (!) 56   Temp 98.1 F (36.7 C) (Oral)   Resp 16   Ht 5\' 10"  (1.778 m)   Wt 197 lb (89.4 kg)   SpO2 98%   BMI 28.27 kg/m       Objective:   Physical Exam  Constitutional: He is oriented to person, place, and time. He appears well-developed and well-nourished. No distress.  HENT:  Head: Normocephalic and atraumatic.  Cardiovascular: Normal rate, regular rhythm, normal heart sounds and intact distal pulses.  Pulmonary/Chest: Effort normal and breath sounds normal.  Abdominal: Soft. He exhibits no distension.  Musculoskeletal: He exhibits no edema.  Neurological: He is alert and oriented to person, place, and time. Coordination normal.  Skin: Skin is warm and dry.  Psychiatric: He has a normal mood and affect. Judgment and thought content normal.      Assessment & Plan:

## 2017-04-19 NOTE — Assessment & Plan Note (Signed)
Maintained on weekly testosterone injections, denies adverse effects. He feels the testosterone "wears off" by the end of the week and he wants to increase his dosage. Recent testosterone labs show levels in normal range. We discussed referral to urology for titration of testosterone, due to risk of adverse effects of the medication and need for specialty management. He does not want to go back to urology at this time and requests to continue f/u with me at same dosage of testosterone that he has been on. Labs ordered today, hell return between 8am and 11a- CBC; Future - PSA; Future - Testosterone; Futurem for draw.

## 2017-04-20 ENCOUNTER — Other Ambulatory Visit (INDEPENDENT_AMBULATORY_CARE_PROVIDER_SITE_OTHER): Payer: 59

## 2017-04-20 DIAGNOSIS — E291 Testicular hypofunction: Secondary | ICD-10-CM

## 2017-04-20 LAB — CBC
HCT: 53.2 % — ABNORMAL HIGH (ref 39.0–52.0)
Hemoglobin: 18 g/dL (ref 13.0–17.0)
MCHC: 33.7 g/dL (ref 30.0–36.0)
MCV: 91.7 fl (ref 78.0–100.0)
Platelets: 140 K/uL — ABNORMAL LOW (ref 150.0–400.0)
RBC: 5.81 Mil/uL (ref 4.22–5.81)
RDW: 13.2 % (ref 11.5–15.5)
WBC: 8.3 K/uL (ref 4.0–10.5)

## 2017-04-20 LAB — PSA: PSA: 0.31 ng/mL (ref 0.10–4.00)

## 2017-04-20 LAB — TESTOSTERONE: Testosterone: 555.79 ng/dL (ref 300.00–890.00)

## 2017-04-22 DIAGNOSIS — G4733 Obstructive sleep apnea (adult) (pediatric): Secondary | ICD-10-CM | POA: Diagnosis not present

## 2017-04-28 ENCOUNTER — Encounter: Payer: Self-pay | Admitting: Nurse Practitioner

## 2017-04-28 ENCOUNTER — Ambulatory Visit (INDEPENDENT_AMBULATORY_CARE_PROVIDER_SITE_OTHER): Payer: 59 | Admitting: Nurse Practitioner

## 2017-04-28 VITALS — BP 130/76 | HR 79 | Temp 98.2°F | Resp 16 | Ht 70.0 in | Wt 197.0 lb

## 2017-04-28 DIAGNOSIS — R5383 Other fatigue: Secondary | ICD-10-CM

## 2017-04-28 DIAGNOSIS — Z13220 Encounter for screening for lipoid disorders: Secondary | ICD-10-CM

## 2017-04-28 DIAGNOSIS — Z0000 Encounter for general adult medical examination without abnormal findings: Secondary | ICD-10-CM | POA: Diagnosis not present

## 2017-04-28 NOTE — Assessment & Plan Note (Signed)
Health maintenance up to date.  Healthy diet and exercise discussed including reduced red meat and 1/2 plate of veggies at meals, increased fruits. Encouraged continued gym workouts and avoidance of soda.  - TSH; Future-fatigue - Lipid panel; Future- screening for cholesterol level Hell return for labs when fasting.  Smoking cessation- handout and quit line given. Encouraged cessation He is thinking about it but not quite ready yet. He will let me know when he is ready to quit so that we can make a plan.

## 2017-04-28 NOTE — Patient Instructions (Addendum)
Please return for labwork when you are fasting.  Please work on adding fresh foods such as fruits and vegetables to your diet. When you make your plate, half of your plate should be veggies, one-fourth carbs, one-fourth meat, and don't eat meat at every meal. Also, remember to stay away from sugary drinks.  Keep up the good work on exercise!  It was nice to see you. Thanks for letting me take care of you today :)  When you are ready to quit smoking, let me know!  1-800-QUIT-NOW  Coping with Quitting Smoking Quitting smoking is a physical and mental challenge. You will face cravings, withdrawal symptoms, and temptation. Before quitting, work with your health care provider to make a plan that can help you cope. Preparation can help you quit and keep you from giving in. How can I cope with cravings? Cravings usually last for 5-10 minutes. If you get through it, the craving will pass. Consider taking the following actions to help you cope with cravings:  Keep your mouth busy: ? Chew sugar-free gum. ? Suck on hard candies or a straw. ? Brush your teeth.  Keep your hands and body busy: ? Immediately change to a different activity when you feel a craving. ? Squeeze or play with a ball. ? Do an activity or a hobby, like making bead jewelry, practicing needlepoint, or working with wood. ? Mix up your normal routine. ? Take a short exercise break. Go for a quick walk or run up and down stairs. ? Spend time in public places where smoking is not allowed.  Focus on doing something kind or helpful for someone else.  Call a friend or family member to talk during a craving.  Join a support group.  Call a quit line, such as 1-800-QUIT-NOW.  Talk with your health care provider about medicines that might help you cope with cravings and make quitting easier for you.  How can I deal with withdrawal symptoms? Your body may experience negative effects as it tries to get used to not having nicotine  in the system. These effects are called withdrawal symptoms. They may include:  Feeling hungrier than normal.  Trouble concentrating.  Irritability.  Trouble sleeping.  Feeling depressed.  Restlessness and agitation.  Craving a cigarette.  To manage withdrawal symptoms:  Avoid places, people, and activities that trigger your cravings.  Remember why you want to quit.  Get plenty of sleep.  Avoid coffee and other caffeinated drinks. These may worsen some of your symptoms.  How can I handle social situations? Social situations can be difficult when you are quitting smoking, especially in the first few weeks. To manage this, you can:  Avoid parties, bars, and other social situations where people might be smoking.  Avoid alcohol.  Leave right away if you have the urge to smoke.  Explain to your family and friends that you are quitting smoking. Ask for understanding and support.  Plan activities with friends or family where smoking is not an option.  What are some ways I can cope with stress? Wanting to smoke may cause stress, and stress can make you want to smoke. Find ways to manage your stress. Relaxation techniques can help. For example:  Breathe slowly and deeply, in through your nose and out through your mouth.  Listen to soothing, relaxing music.  Talk with a family member or friend about your stress.  Light a candle.  Soak in a bath or take a shower.  Think about a  peaceful place.  What are some ways I can prevent weight gain? Be aware that many people gain weight after they quit smoking. However, not everyone does. To keep from gaining weight, have a plan in place before you quit and stick to the plan after you quit. Your plan should include:  Having healthy snacks. When you have a craving, it may help to: ? Eat plain popcorn, crunchy carrots, celery, or other cut vegetables. ? Chew sugar-free gum.  Changing how you eat: ? Eat small portion sizes at  meals. ? Eat 4-6 small meals throughout the day instead of 1-2 large meals a day. ? Be mindful when you eat. Do not watch television or do other things that might distract you as you eat.  Exercising regularly: ? Make time to exercise each day. If you do not have time for a long workout, do short bouts of exercise for 5-10 minutes several times a day. ? Do some form of strengthening exercise, like weight lifting, and some form of aerobic exercise, like running or swimming.  Drinking plenty of water or other low-calorie or no-calorie drinks. Drink 6-8 glasses of water daily, or as much as instructed by your health care provider.  Summary  Quitting smoking is a physical and mental challenge. You will face cravings, withdrawal symptoms, and temptation to smoke again. Preparation can help you as you go through these challenges.  You can cope with cravings by keeping your mouth busy (such as by chewing gum), keeping your body and hands busy, and making calls to family, friends, or a helpline for people who want to quit smoking.  You can cope with withdrawal symptoms by avoiding places where people smoke, avoiding drinks with caffeine, and getting plenty of rest.  Ask your health care provider about the different ways to prevent weight gain, avoid stress, and handle social situations. This information is not intended to replace advice given to you by your health care provider. Make sure you discuss any questions you have with your health care provider. Document Released: 05/01/2016 Document Revised: 05/01/2016 Document Reviewed: 05/01/2016 Elsevier Interactive Patient Education  2018 [place] 18-39 Years, Male Preventive care refers to lifestyle choices and visits with your health care provider that can promote health and wellness. What does preventive care include?  A yearly physical exam. This is also called an annual well check.  Dental exams once or twice a  year.  Routine eye exams. Ask your health care provider how often you should have your eyes checked.  Personal lifestyle choices, including: ? Daily care of your teeth and gums. ? Regular physical activity. ? Eating a healthy diet. ? Avoiding tobacco and drug use. ? Limiting alcohol use. ? Practicing safe sex. What happens during an annual well check? The services and screenings done by your health care provider during your annual well check will depend on your age, overall health, lifestyle risk factors, and family history of disease. Counseling Your health care provider may ask you questions about your:  Alcohol use.  Tobacco use.  Drug use.  Emotional well-being.  Home and relationship well-being.  Sexual activity.  Eating habits.  Work and work Statistician.  Screening You may have the following tests or measurements:  Height, weight, and BMI.  Blood pressure.  Lipid and cholesterol levels. These may be checked every 5 years starting at age 35.  Diabetes screening. This is done by checking your blood sugar (glucose) after you have not eaten for a  while (fasting).  Skin check.  Hepatitis C blood test.  Hepatitis B blood test.  Sexually transmitted disease (STD) testing.  Discuss your test results, treatment options, and if necessary, the need for more tests with your health care provider. Vaccines Your health care provider may recommend certain vaccines, such as:  Influenza vaccine. This is recommended every year.  Tetanus, diphtheria, and acellular pertussis (Tdap, Td) vaccine. You may need a Td booster every 10 years.  Varicella vaccine. You may need this if you have not been vaccinated.  HPV vaccine. If you are 80 or younger, you may need three doses over 6 months.  Measles, mumps, and rubella (MMR) vaccine. You may need at least one dose of MMR.You may also need a second dose.  Pneumococcal 13-valent conjugate (PCV13) vaccine. You may need this  if you have certain conditions and have not been vaccinated.  Pneumococcal polysaccharide (PPSV23) vaccine. You may need one or two doses if you smoke cigarettes or if you have certain conditions.  Meningococcal vaccine. One dose is recommended if you are age 41-21 years and a first-year college student living in a residence hall, or if you have one of several medical conditions. You may also need additional booster doses.  Hepatitis A vaccine. You may need this if you have certain conditions or if you travel or work in places where you may be exposed to hepatitis A.  Hepatitis B vaccine. You may need this if you have certain conditions or if you travel or work in places where you may be exposed to hepatitis B.  Haemophilus influenzae type b (Hib) vaccine. You may need this if you have certain risk factors.  Talk to your health care provider about which screenings and vaccines you need and how often you need them. This information is not intended to replace advice given to you by your health care provider. Make sure you discuss any questions you have with your health care provider. Document Released: 06/30/2001 Document Revised: 01/22/2016 Document Reviewed: 03/05/2015 Elsevier Interactive Patient Education  2017 [employer].

## 2017-04-28 NOTE — Progress Notes (Addendum)
Subjective:    Patient ID: Matthew Norwayonald L Eskenazi Jr., male    DOB: 04/01/1981, 36 y.o.   MRN: [id number]  HPI Matthew Garcia is a 36 yo male who presents today for complete physical.  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 No soda or juice. Exercise: 5 days a week- 1 hour gym workout. he's lost about 30 lbs over 4 months due to going to gym and cutting sodas.  Wt Readings from Last 3 Encounters:  04/28/17 197 lb (89.4 kg)  04/19/17 197 lb (89.4 kg)  09/30/16 214 lb 12.8 oz (97.4 kg)    Review of Systems  Constitutional: Positive for fatigue. Negative for activity change and appetite change.  HENT: Positive for dental problem. Negative for voice change.   Eyes: Negative for visual disturbance.  Respiratory: Negative for cough and shortness of breath.   Cardiovascular: Negative for chest pain and palpitations.  Gastrointestinal: Negative for constipation and diarrhea.  Endocrine: Negative for cold intolerance and heat intolerance.  Genitourinary: Negative for difficulty urinating and hematuria.  Musculoskeletal: Negative for arthralgias and myalgias.  Skin: Negative for rash.  Allergic/Immunologic: Negative for environmental allergies and food allergies.  Neurological: Negative for speech difficulty and headaches.  Hematological: Does not bruise/bleed easily.  Psychiatric/Behavioral:       Negative for depression or anxiety.     Past Medical History:  Diagnosis Date  . Chicken pox   . Erectile dysfunction   . Heart murmur   . Kidney stones   . OSA (obstructive sleep apnea) 11/03/2016       Social History   Socioeconomic History  . Marital status: Married    Spouse name: Armando ReichertMarian  . Number of children: 2  . Years of education: 2012  . Highest education level: Not on file  Social Needs  . Financial resource strain: Not on file  . Food insecurity - worry: Not on file  . Food insecurity - inability: Not on file  . Transportation needs - medical: Not on file  . Transportation needs - non-medical: Not on file  Occupational History  . Occupation: Psychologist, counsellingConstruction Inspector  Tobacco Use  . Smoking status: Former Smoker    Packs/day: 1.00    Years: 15.00    Pack years: 15.00    Types: Cigarettes    Last attempt to quit: 08/16/2012    Years since quitting: 4.7  . Smokeless tobacco: Never Used  Substance and Sexual Activity  . Alcohol use: Yes    Comment: socially  . Drug use: No  . Sexual activity: Yes    Birth control/protection: None  Other Topics Concern  . Not on file  Social History Narrative   [place], [place]. Currently resides in a house. 1 dog 1 cat. Fun: Tinker with a jeep, hang out with his wife. Denies any religious beliefs that would effect health care.     Past Surgical History:  Procedure Laterality Date  . CHOLECYSTECTOMY  12/09/2011   Procedure: LAPAROSCOPIC CHOLECYSTECTOMY;  Surgeon: Atilano InaEric M Wilson, MD,FACS;  Location: [facility];  Service: General;  Laterality: N/A;  . CLAVICLE SURGERY    .  HERNIA REPAIR    . SHOULDER SURGERY      Family History  Problem Relation Age of Onset  . Arthritis Mother   . Thyroid disease Mother   . Fibromyalgia Mother   . Other Mother        Bone Degenerative  . Heart attack Father   . Heart disease Maternal Grandmother   . Stroke Paternal Grandmother   . Healthy Paternal Grandfather   . Thyroid disease Sister   . Thyroid disease Sister   . Lupus Sister   . Anemia Sister   . Diabetes Neg Hx   . Colon cancer Neg Hx   . Colon polyps Neg Hx   . Esophageal cancer Neg Hx   . Stomach cancer Neg Hx   .  Rectal cancer Neg Hx     Allergies  Allergen Reactions  . Hydrocodone Nausea Only    Hycodan Syrup  . Penicillins Other (See Comments)    Unknown (as infant)    Current Outpatient Medications on File Prior to Visit  Medication Sig Dispense Refill  . omeprazole (PRILOSEC) 40 MG capsule Take 1 capsule (40 mg total) by mouth daily. Before dinner 90 capsule 3  . testosterone cypionate (DEPOTESTOSTERONE CYPIONATE) 200 MG/ML injection Inject 0.5 mLs (100 mg total) into the muscle every 7 (seven) days. 10 mL 0   Current Facility-Administered Medications on File Prior to Visit  Medication Dose Route Frequency Provider Last Rate Last Dose  . 0.9 %  sodium chloride infusion  500 mL Intravenous Continuous Rachael FeeJacobs, Daniel P, MD            Objective:   Physical Exam  BP 130/76 (BP Location: Left Arm, Patient Position: Sitting, Cuff Size: Large)   Pulse 79   Temp 98.2 F (36.8 C) (Oral)   Resp 16   Ht 5\' 10"  (1.778 m)   Wt 197 lb (89.4 kg)   SpO2 96%   BMI 28.27 kg/m   General Appearance:    Alert, cooperative, no distress, appears stated age  Head:    Normocephalic, without obvious abnormality, atraumatic  Eyes:    PERRL, conjunctiva/corneas clear, EOM's intact     Ears:    Normal TM's and external ear canals, both ears  Nose:   Nares normal, septum midline, mucosa normal, no drainage   or sinus tenderness  Throat:   Lips, mucosa, and tongue normal; poor dentition  Neck:   Supple, symmetrical, trachea midline, no adenopathy;       thyroid:  No enlargement/tenderness/nodules; no carotid   bruit  Back:     Symmetric, no curvature, ROM normal, no CVA tenderness  Lungs:     Clear to auscultation bilaterally, respirations unlabored  Chest wall:    No tenderness or deformity  Heart:    Regular rate and rhythm, heart sounds normal, no murmur  Abdomen:     Soft, non-tender, bowel sounds active all four quadrants,    no masses, no organomegaly        Extremities:   Extremities normal,  atraumatic, no cyanosis or edema  Pulses:   2+ and symmetric all extremities  Skin:   Skin color, texture, turgor normal, no rashes or lesions  Lymph nodes:   Cervical, supraclavicular nodes normal  Neurologic:   CNII-XII intact. Normal strength, sensation and reflexes      throughout        Assessment & Plan:

## 2017-05-07 ENCOUNTER — Other Ambulatory Visit: Payer: Self-pay

## 2017-05-23 DIAGNOSIS — G4733 Obstructive sleep apnea (adult) (pediatric): Secondary | ICD-10-CM | POA: Diagnosis not present

## 2017-05-26 ENCOUNTER — Encounter: Payer: Self-pay | Admitting: Nurse Practitioner

## 2017-05-28 ENCOUNTER — Telehealth: Payer: Self-pay | Admitting: Nurse Practitioner

## 2017-05-28 ENCOUNTER — Other Ambulatory Visit: Payer: Self-pay | Admitting: Nurse Practitioner

## 2017-05-28 DIAGNOSIS — E291 Testicular hypofunction: Secondary | ICD-10-CM

## 2017-05-28 MED ORDER — VARENICLINE TARTRATE 1 MG PO TABS: 1.0000 mg | ORAL_TABLET | Freq: Two times a day (BID) | ORAL | 2 refills | Status: DC

## 2017-05-28 MED ORDER — VARENICLINE TARTRATE 0.5 MG PO TABS: 0.5000 mg | ORAL_TABLET | Freq: Two times a day (BID) | ORAL | 0 refills | Status: DC

## 2017-05-28 NOTE — Telephone Encounter (Signed)
Pt sent a my chart message on 05/26/17. Please advise on response.

## 2017-05-28 NOTE — Telephone Encounter (Signed)
Sent pt my chart message in response to previous message regarding this issue.

## 2017-05-28 NOTE — Progress Notes (Signed)
orders

## 2017-05-28 NOTE — Telephone Encounter (Signed)
Response sent via mychart.

## 2017-05-28 NOTE — Telephone Encounter (Signed)
Copied from CRM [phone]#[id number]. Topic: Quick Communication - See Telephone Encounter >> May 28, 2017 10:20 AM Rudi CocoLathan, Iliyana Convey M, NT wrote: CRM for notification. See Telephone encounter for:   05/28/17. Pt. Has questions about his med. Testosterone pt. Would like to speak with Dr. Melynda Kellerr nurse/. Pt. Can be reached at [phone]

## 2017-06-14 ENCOUNTER — Other Ambulatory Visit (INDEPENDENT_AMBULATORY_CARE_PROVIDER_SITE_OTHER): Payer: 59

## 2017-06-14 DIAGNOSIS — E291 Testicular hypofunction: Secondary | ICD-10-CM

## 2017-06-14 LAB — CBC
HCT: 48.9 % (ref 39.0–52.0)
HEMOGLOBIN: 16.8 g/dL (ref 13.0–17.0)
MCHC: 34.4 g/dL (ref 30.0–36.0)
MCV: 89.5 fl (ref 78.0–100.0)
PLATELETS: 137 10*3/uL — AB (ref 150.0–400.0)
RBC: 5.46 Mil/uL (ref 4.22–5.81)
RDW: 13.1 % (ref 11.5–15.5)
WBC: 9.2 10*3/uL (ref 4.0–10.5)

## 2017-06-16 ENCOUNTER — Encounter: Payer: Self-pay | Admitting: Nurse Practitioner

## 2017-06-16 ENCOUNTER — Other Ambulatory Visit: Payer: Self-pay | Admitting: Nurse Practitioner

## 2017-06-16 DIAGNOSIS — E291 Testicular hypofunction: Secondary | ICD-10-CM

## 2017-06-16 MED ORDER — TESTOSTERONE CYPIONATE 200 MG/ML IM SOLN: 100.0000 mg | INTRAMUSCULAR | 0 refills | Status: DC

## 2017-06-18 ENCOUNTER — Telehealth: Payer: Self-pay | Admitting: Nurse Practitioner

## 2017-06-18 MED ORDER — TESTOSTERONE ENANTHATE 200 MG/ML IM SOLN: 50.0000 mg | INTRAMUSCULAR | 0 refills | Status: DC

## 2017-06-18 NOTE — Telephone Encounter (Signed)
Pt has already picked up rx told pharmacy to put the new rx on hold until he is due for refill.

## 2017-06-18 NOTE — Telephone Encounter (Signed)
Can we call his pharmacy to cancel the order for  testosterone cypionate sent on 1/30? I have changed the prescription at his reqest.

## 2017-06-23 DIAGNOSIS — G4733 Obstructive sleep apnea (adult) (pediatric): Secondary | ICD-10-CM | POA: Diagnosis not present

## 2017-07-21 DIAGNOSIS — G4733 Obstructive sleep apnea (adult) (pediatric): Secondary | ICD-10-CM | POA: Diagnosis not present

## 2017-08-02 DIAGNOSIS — J029 Acute pharyngitis, unspecified: Secondary | ICD-10-CM | POA: Diagnosis not present

## 2017-08-18 ENCOUNTER — Telehealth: Payer: Self-pay

## 2017-08-18 ENCOUNTER — Other Ambulatory Visit: Payer: Self-pay

## 2017-08-18 DIAGNOSIS — E291 Testicular hypofunction: Secondary | ICD-10-CM

## 2017-08-18 MED ORDER — TESTOSTERONE ENANTHATE 200 MG/ML IM SOLN: 50.0000 mg | INTRAMUSCULAR | 0 refills | Status: DC

## 2017-08-18 NOTE — Telephone Encounter (Signed)
Please advise on refill of testosterone in Matthew Garcia's absence. Thank you. Last refill was 07/03/17 per database.

## 2017-08-18 NOTE — Telephone Encounter (Signed)
rx sent to [facility]

## 2017-08-21 DIAGNOSIS — G4733 Obstructive sleep apnea (adult) (pediatric): Secondary | ICD-10-CM | POA: Diagnosis not present

## 2017-09-20 DIAGNOSIS — G4733 Obstructive sleep apnea (adult) (pediatric): Secondary | ICD-10-CM | POA: Diagnosis not present

## 2017-10-21 DIAGNOSIS — G4733 Obstructive sleep apnea (adult) (pediatric): Secondary | ICD-10-CM | POA: Diagnosis not present

## 2017-11-09 ENCOUNTER — Other Ambulatory Visit: Payer: Self-pay | Admitting: Internal Medicine

## 2017-11-09 DIAGNOSIS — E291 Testicular hypofunction: Secondary | ICD-10-CM

## 2017-11-10 NOTE — Telephone Encounter (Signed)
Last refill was 08/19/17 per database.

## 2017-11-16 ENCOUNTER — Other Ambulatory Visit: Payer: Self-pay | Admitting: Nurse Practitioner

## 2017-11-16 DIAGNOSIS — E291 Testicular hypofunction: Secondary | ICD-10-CM

## 2017-11-23 ENCOUNTER — Other Ambulatory Visit (INDEPENDENT_AMBULATORY_CARE_PROVIDER_SITE_OTHER): Payer: 59

## 2017-11-23 DIAGNOSIS — E291 Testicular hypofunction: Secondary | ICD-10-CM

## 2017-11-23 LAB — TESTOSTERONE: Testosterone: 108.78 ng/dL — ABNORMAL LOW (ref 300.00–890.00)

## 2017-11-23 LAB — CBC
HEMATOCRIT: 47.1 % (ref 39.0–52.0)
HEMOGLOBIN: 16.5 g/dL (ref 13.0–17.0)
MCHC: 35 g/dL (ref 30.0–36.0)
MCV: 87.7 fl (ref 78.0–100.0)
PLATELETS: 170 10*3/uL (ref 150.0–400.0)
RBC: 5.37 Mil/uL (ref 4.22–5.81)
RDW: 13 % (ref 11.5–15.5)
WBC: 5.9 10*3/uL (ref 4.0–10.5)

## 2017-11-25 ENCOUNTER — Encounter: Payer: Self-pay | Admitting: Nurse Practitioner

## 2017-11-25 ENCOUNTER — Other Ambulatory Visit: Payer: Self-pay | Admitting: Nurse Practitioner

## 2017-11-25 DIAGNOSIS — E291 Testicular hypofunction: Secondary | ICD-10-CM

## 2017-11-25 NOTE — Progress Notes (Signed)
efer

## 2018-01-06 DIAGNOSIS — E349 Endocrine disorder, unspecified: Secondary | ICD-10-CM | POA: Diagnosis not present

## 2018-02-14 ENCOUNTER — Other Ambulatory Visit: Payer: Self-pay | Admitting: Nurse Practitioner

## 2018-02-14 NOTE — Telephone Encounter (Signed)
This is just the starter pack, not daily maintenance dose. Can we find out how he has been taking this? When he started it and what dose he is taking?

## 2018-02-14 NOTE — Telephone Encounter (Signed)
Ok to Rf quantity #11?

## 2018-02-16 MED ORDER — VARENICLINE TARTRATE 1 MG PO TABS: 1.0000 mg | ORAL_TABLET | Freq: Two times a day (BID) | ORAL | 2 refills | Status: DC

## 2018-02-16 NOTE — Telephone Encounter (Signed)
I called pt- he is currently taking the maintenance dose. I cancelled the starter dose Rx request.  Refill sent for continuing dose. See meds.

## 2018-06-16 ENCOUNTER — Encounter: Payer: Self-pay | Admitting: Nurse Practitioner

## 2018-07-21 DIAGNOSIS — N401 Enlarged prostate with lower urinary tract symptoms: Secondary | ICD-10-CM | POA: Diagnosis not present

## 2018-07-21 DIAGNOSIS — R351 Nocturia: Secondary | ICD-10-CM | POA: Diagnosis not present

## 2018-07-21 DIAGNOSIS — E349 Endocrine disorder, unspecified: Secondary | ICD-10-CM | POA: Diagnosis not present

## 2018-07-28 DIAGNOSIS — R6882 Decreased libido: Secondary | ICD-10-CM | POA: Diagnosis not present

## 2018-07-28 DIAGNOSIS — R5382 Chronic fatigue, unspecified: Secondary | ICD-10-CM | POA: Diagnosis not present

## 2018-07-28 DIAGNOSIS — E349 Endocrine disorder, unspecified: Secondary | ICD-10-CM | POA: Diagnosis not present

## 2019-07-24 ENCOUNTER — Telehealth: Payer: Self-pay | Admitting: Internal Medicine

## 2019-07-24 NOTE — Telephone Encounter (Signed)
Yes, I will see him.

## 2019-07-24 NOTE — Telephone Encounter (Signed)
  Are you willing to accept patient, last saw [facility] in 2018  Please advise

## 2019-08-08 ENCOUNTER — Ambulatory Visit: Payer: 59 | Admitting: Internal Medicine

## 2019-08-08 ENCOUNTER — Other Ambulatory Visit: Payer: Self-pay

## 2019-08-08 ENCOUNTER — Encounter: Payer: Self-pay | Admitting: Internal Medicine

## 2019-08-08 VITALS — BP 136/82 | HR 71 | Temp 98.2°F | Ht 70.0 in | Wt 210.0 lb

## 2019-08-08 DIAGNOSIS — Z720 Tobacco use: Secondary | ICD-10-CM

## 2019-08-08 MED ORDER — CHANTIX STARTING MONTH PAK 0.5 MG X 11 & 1 MG X 42 PO TABS: ORAL_TABLET | ORAL | 0 refills | Status: DC

## 2019-08-08 MED ORDER — VARENICLINE TARTRATE 1 MG PO TABS: 1.0000 mg | ORAL_TABLET | Freq: Two times a day (BID) | ORAL | 3 refills | Status: DC

## 2019-08-08 NOTE — Progress Notes (Signed)
Subjective:  Patient ID: Matthew Garcia., male    DOB: April 16, 1981  Age: 39 y.o. MRN: [id number]  CC: Nicotine Dependence  This visit occurred during the SARS-CoV-2 public health emergency.  Safety protocols were in place, including screening questions prior to the visit, additional usage of staff PPE, and extensive cleaning of exam room while observing appropriate contact time as indicated for disinfecting solutions.    HPI Matthew Garcia. presents for establishing a primary care relationship.  His previous provider left the practice about a year ago.  He used to be a cigarette smoker and now he smokes cigars.  He feels like he is addicted to nicotine and wants to take Chantix to help him quit smoking.  History Matthew Garcia has a past medical history of Chicken pox, Erectile dysfunction, Heart murmur, Kidney stones, and OSA (obstructive sleep apnea) (11/03/2016).   He has a past surgical history that includes Shoulder surgery; Hernia repair; Clavicle surgery; and Cholecystectomy (12/09/2011).   His family history includes Anemia in his sister; Arthritis in his mother; Fibromyalgia in his mother; Healthy in his paternal grandfather; Heart attack in his father; Heart disease in his maternal grandmother; Lupus in his sister; Other in his mother; Stroke in his paternal grandmother; Thyroid disease in his mother, sister, and sister.He reports that he has been smoking cigars. He has a 15.00 pack-year smoking history. He has never used smokeless tobacco. He reports previous alcohol use. He reports that he does not use drugs.  Outpatient Medications Prior to Visit  Medication Sig Dispense Refill  . alfuzosin (UROXATRAL) 10 MG 24 hr tablet Take 10 mg by mouth at bedtime.    Marland Kitchen omeprazole (PRILOSEC) 40 MG capsule Take 1 capsule (40 mg total) by mouth daily. Before dinner 90 capsule 3  . testosterone enanthate (DELATESTRYL) 200 MG/ML injection Inject 0.25 mLs (50 mg total) into the muscle every 7  (seven) days. For IM use only 5 mL 0  . varenicline (CHANTIX CONTINUING MONTH PAK) 1 MG tablet Take 1 tablet (1 mg total) by mouth 2 (two) times daily. 60 tablet 2  . varenicline (CHANTIX) 0.5 MG tablet Take 1 tablet (0.5 mg total) by mouth 2 (two) times daily. Days 1 to 3 take 0.5 mg once daily, Days 4 to 7 take 0.5 mg twice daily 11 tablet 0  . 0.9 %  sodium chloride infusion      No facility-administered medications prior to visit.    ROS Review of Systems  Constitutional: Negative for chills, diaphoresis, fatigue and fever.  HENT: Negative.   Eyes: Negative for visual disturbance.  Respiratory: Negative for cough, chest tightness, shortness of breath and wheezing.   Cardiovascular: Negative for chest pain, palpitations and leg swelling.  Gastrointestinal: Negative for abdominal pain.  Endocrine: Negative.   Genitourinary: Negative.  Negative for difficulty urinating.  Musculoskeletal: Negative.  Negative for arthralgias and myalgias.  Skin: Negative for color change and pallor.  Neurological: Negative.  Negative for dizziness, weakness, light-headedness and numbness.  Hematological: Negative for adenopathy. Does not bruise/bleed easily.  Psychiatric/Behavioral: Negative.     Objective:  BP 136/82 (BP Location: Left Arm, Patient Position: Sitting, Cuff Size: Large)   Pulse 71   Temp 98.2 F (36.8 C) (Oral)   Ht 5\' 10"  (1.778 m)   Wt 210 lb (95.3 kg)   SpO2 94%   BMI 30.13 kg/m   Physical Exam Vitals reviewed.  Constitutional:      Appearance: Normal appearance.  HENT:  Nose: Nose normal.     Mouth/Throat:     Mouth: Mucous membranes are moist.  Eyes:     General: No scleral icterus.    Conjunctiva/sclera: Conjunctivae normal.  Cardiovascular:     Rate and Rhythm: Normal rate and regular rhythm.     Heart sounds: No murmur.  Pulmonary:     Effort: Pulmonary effort is normal.     Breath sounds: No stridor. No wheezing, rhonchi or rales.  Abdominal:      General: Abdomen is flat. Bowel sounds are normal. There is no distension.     Palpations: Abdomen is soft. There is no hepatomegaly, splenomegaly or mass.     Tenderness: There is no abdominal tenderness.  Musculoskeletal:        General: Normal range of motion.     Cervical back: Neck supple.  Lymphadenopathy:     Cervical: No cervical adenopathy.  Skin:    General: Skin is warm and dry.  Neurological:     General: No focal deficit present.     Mental Status: He is alert.     Lab Results  Component Value Date   WBC 5.9 11/23/2017   HGB 16.5 11/23/2017   HCT 47.1 11/23/2017   PLT 170.0 11/23/2017   GLUCOSE 104 (H) 07/06/2014   CHOL 134 08/26/2012   TRIG 83 08/26/2012   HDL 34 (L) 08/26/2012   LDLCALC 83 08/26/2012   ALT 19 08/26/2012   AST 20 08/26/2012   NA 140 07/06/2014   K 4.2 07/06/2014   CL 103 07/06/2014   CREATININE 0.93 07/06/2014   BUN 8 07/06/2014   CO2 31 07/06/2014   TSH 2.85 07/06/2014   PSA 0.31 04/20/2017    Assessment & Plan:   Ankith was seen today for nicotine dependence.  Diagnoses and all orders for this visit:  Tobacco abuse disorder -     varenicline (CHANTIX CONTINUING MONTH PAK) 1 MG tablet; Take 1 tablet (1 mg total) by mouth 2 (two) times daily. -     varenicline (CHANTIX STARTING MONTH PAK) 0.5 MG X 11 & 1 MG X 42 tablet; Take one 0.5 mg tablet by mouth once daily for 3 days, then increase to one 0.5 mg tablet twice daily for 4 days, then increase to one 1 mg tablet twice daily.   I have discontinued Matthew L. Holquin Jr.'s varenicline, testosterone enanthate, and varenicline. I am also having him start on varenicline and Chantix Starting Month Pak. Additionally, I am having him maintain his omeprazole and alfuzosin. We will stop administering sodium chloride.  Meds ordered this encounter  Medications  . varenicline (CHANTIX CONTINUING MONTH PAK) 1 MG tablet    Sig: Take 1 tablet (1 mg total) by mouth 2 (two) times daily.     Dispense:  60 tablet    Refill:  3  . varenicline (CHANTIX STARTING MONTH PAK) 0.5 MG X 11 & 1 MG X 42 tablet    Sig: Take one 0.5 mg tablet by mouth once daily for 3 days, then increase to one 0.5 mg tablet twice daily for 4 days, then increase to one 1 mg tablet twice daily.    Dispense:  53 tablet    Refill:  0     Follow-up: Return in about 3 months (around 11/08/2019).  Scarlette Calico, MD

## 2019-08-08 NOTE — Patient Instructions (Signed)

## 2019-08-09 ENCOUNTER — Encounter: Payer: Self-pay | Admitting: Internal Medicine

## 2019-11-15 ENCOUNTER — Ambulatory Visit (INDEPENDENT_AMBULATORY_CARE_PROVIDER_SITE_OTHER): Payer: 59

## 2019-11-15 ENCOUNTER — Other Ambulatory Visit: Payer: Self-pay

## 2019-11-15 ENCOUNTER — Ambulatory Visit: Payer: 59 | Admitting: Podiatry

## 2019-11-15 DIAGNOSIS — M79672 Pain in left foot: Secondary | ICD-10-CM

## 2019-11-15 DIAGNOSIS — B070 Plantar wart: Secondary | ICD-10-CM | POA: Diagnosis not present

## 2019-11-15 DIAGNOSIS — L989 Disorder of the skin and subcutaneous tissue, unspecified: Secondary | ICD-10-CM

## 2019-11-15 NOTE — Progress Notes (Signed)
   Subjective: 39 y.o. male presenting today as a new patient referral from Dr. Elijah Birk, local podiatrist, for evaluation of left forefoot pain under the fifth MTPJ.  Patient suspects possible bone spur.  He states that he has been doing increased walking and exercising and noticed pain underneath the fifth toe.  This is been ongoing for approximately 2 months now.  He denies injury.  He tried some OTC corn and callus remover which did not help alleviate his symptoms.  He was referred here for further treatment and evaluation   Past Medical History:  Diagnosis Date  . Chicken pox   . Erectile dysfunction   . Heart murmur   . Kidney stones   . OSA (obstructive sleep apnea) 11/03/2016    Objective: Physical Exam General: The patient is alert and oriented x3 in no acute distress.   Dermatology: Hyperkeratotic skin lesion(s) noted to the plantar aspect of the left foot approximately 1.0 cm in diameter with a central nucleated core.  Skin is warm, dry and supple bilateral lower extremities. Negative for open lesions or macerations.   Vascular: Palpable pedal pulses bilaterally. No edema or erythema noted. Capillary refill within normal limits.   Neurological: Epicritic and protective threshold grossly intact bilaterally.    Musculoskeletal Exam: Pain on palpation to the noted skin lesion(s).  Range of motion within normal limits to all pedal and ankle joints bilateral. Muscle strength 5/5 in all groups bilateral.    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 #2 Excisional debridement of the plantar wart lesion(s) was performed using a chisel blade. Cantharone was applied and the lesion(s) was dressed with a dry sterile dressing. 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 Felecia Shelling, DPM    [address]                                        [place], [place] [address]                Office [phone]  Fax [phone]

## 2019-11-28 ENCOUNTER — Telehealth: Payer: Self-pay

## 2019-11-28 NOTE — Telephone Encounter (Signed)
Patient scheduled for tomorrow 11/29/2019 at 8:00am with Dr Okey Dupre.

## 2019-11-28 NOTE — Telephone Encounter (Signed)
Pt will have to schedule an appointment.

## 2019-11-28 NOTE — Telephone Encounter (Signed)
New message    The patient is asking for a prescription for poison ivy does not feel an appointment is needed.   Advise the patient he will need an appointment.  [id number] - [place], [place] - [address]

## 2019-11-29 ENCOUNTER — Other Ambulatory Visit: Payer: Self-pay

## 2019-11-29 ENCOUNTER — Ambulatory Visit: Payer: 59 | Admitting: Internal Medicine

## 2019-11-29 ENCOUNTER — Encounter: Payer: Self-pay | Admitting: Internal Medicine

## 2019-11-29 DIAGNOSIS — R21 Rash and other nonspecific skin eruption: Secondary | ICD-10-CM | POA: Diagnosis not present

## 2019-11-29 MED ORDER — TRIAMCINOLONE ACETONIDE 0.1 % EX CREA: 1.0000 "application " | TOPICAL_CREAM | Freq: Two times a day (BID) | CUTANEOUS | 0 refills | Status: DC

## 2019-11-29 MED ORDER — PREDNISONE 20 MG PO TABS: 40.0000 mg | ORAL_TABLET | Freq: Every day | ORAL | 0 refills | Status: DC

## 2019-11-29 NOTE — Assessment & Plan Note (Signed)
Consistent with contact dermatitis. Rx prednisone and triamcinolone ointment. If not resolving call back.

## 2019-11-29 NOTE — Progress Notes (Signed)
   Subjective:   Patient ID: Matthew Garcia., male    DOB: 1981-01-09, 39 y.o.   MRN: [id number]  HPI The patient is a 39 YO male coming in for concerns about rash. Thinks he had poison ivy exposure through his dog. This weekend he started having rash on back and leg. They are itching a lot and overall worsening. Used hydrocortisone cream on it but this did not help.  Denies fevers or chills. Started about 2-3 days ago.   Review of Systems  Constitutional: Negative.   HENT: Negative.   Eyes: Negative.   Respiratory: Negative for cough, chest tightness and shortness of breath.   Cardiovascular: Negative for chest pain, palpitations and leg swelling.  Gastrointestinal: Negative for abdominal distention, abdominal pain, constipation, diarrhea, nausea and vomiting.  Musculoskeletal: Negative.   Skin: Positive for rash.  Neurological: Negative.   Psychiatric/Behavioral: Negative.     Objective:  Physical Exam Constitutional:      Appearance: He is well-developed.  HENT:     Head: Normocephalic and atraumatic.  Cardiovascular:     Rate and Rhythm: Normal rate and regular rhythm.  Pulmonary:     Effort: Pulmonary effort is normal. No respiratory distress.     Breath sounds: Normal breath sounds. No wheezing or rales.  Abdominal:     General: Bowel sounds are normal. There is no distension.     Palpations: Abdomen is soft.     Tenderness: There is no abdominal tenderness. There is no rebound.  Musculoskeletal:     Cervical back: Normal range of motion.  Skin:    General: Skin is warm and dry.  Neurological:     Mental Status: He is alert and oriented to person, place, and time.     Coordination: Coordination normal.     Vitals:   11/29/19 0802  BP: 124/78  Pulse: 64  Temp: 98.3 F (36.8 C)  TempSrc: Oral  SpO2: 98%  Weight: 197 lb (89.4 kg)  Height: 5\' 10"  (1.778 m)   This visit occurred during the SARS-CoV-2 public health emergency.  Safety protocols were in  place, including screening questions prior to the visit, additional usage of staff PPE, and extensive cleaning of exam room while observing appropriate contact time as indicated for disinfecting solutions.   Assessment & Plan:

## 2019-11-29 NOTE — Patient Instructions (Signed)
We have sent in the prednisone to take 2 pills a day for 3 days then stop.  We have also sent in triamcinolone cream to use twice a day on the areas.

## 2019-12-04 ENCOUNTER — Other Ambulatory Visit: Payer: Self-pay

## 2019-12-04 ENCOUNTER — Encounter: Payer: Self-pay | Admitting: Podiatry

## 2019-12-04 ENCOUNTER — Ambulatory Visit: Payer: 59 | Admitting: Podiatry

## 2019-12-04 DIAGNOSIS — B070 Plantar wart: Secondary | ICD-10-CM

## 2019-12-04 DIAGNOSIS — L989 Disorder of the skin and subcutaneous tissue, unspecified: Secondary | ICD-10-CM | POA: Diagnosis not present

## 2019-12-04 NOTE — Progress Notes (Signed)
° °  Subjective: Patient presents today for follow-up evaluation of a skin lesion to the subfifth MTPJ to the left lower extremity.  Last visit on 11/15/2019 Cantharone was applied.  Patient states it hurt for approximately 5 days but however after that he has been feeling much better.  He has been working with minimal pain to the area.  He notices significant improvement patient presents today for follow-up treatment and evaluation   Objective: Physical Exam General: The patient is alert and oriented x3 in no acute distress.   Dermatology: Hyperkeratotic skin lesion noted to the plantar aspect of the left subfifth MTPJ approximately 1 cm in diameter. Skin is warm, dry and supple bilateral lower extremities. Negative for open lesions or macerations.   Vascular: Palpable pedal pulses bilaterally. No edema or erythema noted. Capillary refill within normal limits.   Neurological: Epicritic and protective threshold grossly intact bilaterally.    Musculoskeletal Exam:  Minimal pain on palpation to the noted skin lesion.  Range of motion within normal limits to all pedal and ankle joints bilateral. Muscle strength 5/5 in all groups bilateral.    Assessment: #1  Porokeratosis left subfifth MTPJ #2 pain in left foot-resolved     Plan of Care:  #1 Patient was evaluated. #2 Excisional debridement of the plantar wart lesion was performed using a chisel blade.  Salicylic acid applied.  Salinocaine provided for the patient today.  Recommend daily application x4 weeks #3 return to clinic prn     Felecia Shelling, DPM [facility]  Dr. Felecia Shelling, DPM    [address] [place], [place] [address]                Office [phone]  Fax [phone]

## 2020-11-20 ENCOUNTER — Ambulatory Visit (HOSPITAL_COMMUNITY)
Admission: EM | Admit: 2020-11-20 | Discharge: 2020-11-20 | Disposition: A | Discharge status: Home / Self Care | Payer: 59 | Attending: Medical Oncology | Admitting: Medical Oncology

## 2020-11-20 ENCOUNTER — Telehealth: Payer: 59 | Admitting: Internal Medicine

## 2020-11-20 ENCOUNTER — Other Ambulatory Visit: Payer: Self-pay

## 2020-11-20 ENCOUNTER — Encounter (HOSPITAL_COMMUNITY): Payer: Self-pay

## 2020-11-20 DIAGNOSIS — M436 Torticollis: Secondary | ICD-10-CM | POA: Diagnosis not present

## 2020-11-20 DIAGNOSIS — W57XXXA Bitten or stung by nonvenomous insect and other nonvenomous arthropods, initial encounter: Secondary | ICD-10-CM

## 2020-11-20 MED ORDER — DOXYCYCLINE HYCLATE 100 MG PO CAPS: 100.0000 mg | ORAL_CAPSULE | Freq: Two times a day (BID) | ORAL | 0 refills | Status: DC

## 2020-11-20 NOTE — ED Triage Notes (Addendum)
Pt c/o soreness and stiffness in neck, fever, chills, back soreness, nauseated on and off. Took at home Covid test today negative.  Pt has bug bite on right lateral leg. No difficulty breathing.  Started: Bug bite was last week, and symptoms symptoms started this week (insect unknown).  Interventions: none

## 2020-11-20 NOTE — ED Provider Notes (Addendum)
[facility]    CSN: [id number] Arrival date & time: 11/20/20  0946      History   Chief Complaint Chief Complaint  Patient presents with   neck stiffness   Insect Bite    HPI Matthew Garcia. is a 40 y.o. male.   HPI  Neck Stiffness: According to patient he has had soreness and stiffness in his neck, subjective fevers, chills, back pain and nausea that started a few days ago.  He is concerned that this is either COVID-19 so he took a home test today which was negative or related to a bug bite he sustained on his right leg a week ago.  He reports that he has not tried anything for symptoms.  No known sick contacts.  He denies any visual changes, dizziness, vomiting, neuro changes.   Past Medical History:  Diagnosis Date   Chicken pox    Erectile dysfunction    Heart murmur    Kidney stones    OSA (obstructive sleep apnea) 11/03/2016    Patient Active Problem List   Diagnosis Date Noted   Rash 11/29/2019   Tobacco abuse disorder 08/08/2019   Routine general medical examination at a health care facility 04/28/2017   OSA (obstructive sleep apnea) 11/03/2016   Hypogonadism in male 07/16/2014   Decreased libido 07/06/2014   Kidney stones     Past Surgical History:  Procedure Laterality Date   CHOLECYSTECTOMY  12/09/2011   Procedure: LAPAROSCOPIC CHOLECYSTECTOMY;  Surgeon: Atilano Ina, MD,FACS;  Location: [facility];  Service: General;  Laterality: N/A;   CLAVICLE SURGERY     HERNIA REPAIR     SHOULDER SURGERY         Home Medications    Prior to Admission medications   Medication Sig Start Date End Date Taking? Authorizing Provider  alfuzosin (UROXATRAL) 10 MG 24 hr tablet Take 10 mg by mouth at bedtime. 07/25/19   [provider]  omeprazole (PRILOSEC) 40 MG capsule Take 1 capsule (40 mg total) by mouth daily. Before dinner 03/11/16   Rachael Fee, MD  predniSONE (DELTASONE) 20 MG tablet Take 2 tablets (40 mg total) by mouth daily with  breakfast. 11/29/19   Myrlene Broker, MD  testosterone cypionate (DEPOTESTOSTERONE CYPIONATE) 200 MG/ML injection SMARTSIG:0.5 Milliliter(s) IM Once a Week 11/28/19   [provider]  triamcinolone cream (KENALOG) 0.1 % Apply 1 application topically 2 (two) times daily. 11/29/19   Myrlene Broker, MD  varenicline (CHANTIX CONTINUING MONTH PAK) 1 MG tablet Take 1 tablet (1 mg total) by mouth 2 (two) times daily. 08/08/19   Etta Grandchild, MD  varenicline (CHANTIX STARTING MONTH PAK) 0.5 MG X 11 & 1 MG X 42 tablet Take one 0.5 mg tablet by mouth once daily for 3 days, then increase to one 0.5 mg tablet twice daily for 4 days, then increase to one 1 mg tablet twice daily. 08/08/19   Etta Grandchild, MD    Family History Family History  Problem Relation Age of Onset   Arthritis Mother    Thyroid disease Mother    Fibromyalgia Mother    Other Mother        Bone Degenerative   Heart attack Father    Heart disease Maternal Grandmother    Stroke Paternal Grandmother    Healthy Paternal Grandfather    Thyroid disease Sister    Thyroid disease Sister    Lupus Sister    Anemia Sister  Diabetes Neg Hx    Colon cancer Neg Hx    Colon polyps Neg Hx    Esophageal cancer Neg Hx    Stomach cancer Neg Hx    Rectal cancer Neg Hx     Social History Social History   Tobacco Use   Smoking status: Light Smoker    Packs/day: 1.00    Years: 15.00    Pack years: 15.00    Types: Cigars, Cigarettes    Last attempt to quit: 08/16/2012    Years since quitting: 8.2   Smokeless tobacco: Never  Vaping Use   Vaping Use: Every day  Substance Use Topics   Alcohol use: Yes    Comment: socially   Drug use: No     Allergies   Hydrocodone and Penicillins   Review of Systems Review of Systems  As stated above in HPI Physical Exam Triage Vital Signs ED Triage Vitals  Enc Vitals Group     BP 11/20/20 1034 133/84     Pulse Rate 11/20/20 1034 85     Resp 11/20/20 1034 18      Temp 11/20/20 1034 98.3 F (36.8 C)     Temp Source 11/20/20 1034 Oral     SpO2 11/20/20 1034 96 %     Weight --      Height --      Head Circumference --      Peak Flow --      Pain Score 11/20/20 1031 6     Pain Loc --      Pain Edu? --      Excl. in GC? --    No data found.  Updated Vital Signs BP 133/84 (BP Location: Left Arm)   Pulse 85   Temp 98.3 F (36.8 C) (Oral)   Resp 18   SpO2 96%   Physical Exam Vitals and nursing note reviewed.  Constitutional:      General: He is not in acute distress.    Appearance: Normal appearance. He is not ill-appearing, toxic-appearing or diaphoretic.  HENT:     Head: Normocephalic and atraumatic.     Nose: Nose normal.     Mouth/Throat:     Mouth: Mucous membranes are moist.  Eyes:     Extraocular Movements: Extraocular movements intact.     Pupils: Pupils are equal, round, and reactive to light.  Neck:     Thyroid: No thyroid mass or thyromegaly.     Vascular: No carotid bruit.     Meningeal: Brudzinski's sign and Kernig's sign absent.  Cardiovascular:     Rate and Rhythm: Normal rate and regular rhythm.     Heart sounds: Normal heart sounds.  Pulmonary:     Effort: Pulmonary effort is normal.     Breath sounds: Normal breath sounds.  Musculoskeletal:     Cervical back: Normal range of motion and neck supple. No rigidity, tenderness or crepitus. No muscular tenderness.  Lymphadenopathy:     Cervical: No cervical adenopathy.  Skin:    Comments: There is a 1 inch halo surrounding a centralized single bite mark of the left thigh with scant induration.   Neurological:     General: No focal deficit present.     Mental Status: He is alert and oriented to person, place, and time.     Cranial Nerves: No cranial nerve deficit.     Sensory: No sensory deficit.     Motor: No weakness.     Coordination: Coordination normal.  Gait: Gait normal.     Deep Tendon Reflexes: Reflexes normal.  Psychiatric:        Mood and Affect:  Mood normal.        Behavior: Behavior normal.        Thought Content: Thought content normal.        Judgment: Judgment normal.     UC Treatments / Results  Labs (all labs ordered are listed, but only abnormal results are displayed) Labs Reviewed - No data to display  EKG   Radiology No results found.  Procedures Procedures (including critical care time)  Medications Ordered in UC Medications - No data to display  Initial Impression / Assessment and Plan / UC Course  I have reviewed the triage vital signs and the nursing notes.  Pertinent labs & imaging results that were available during my care of the patient were reviewed by me and considered in my medical decision making (see chart for details).     New.  He has had a negative COVID test.  He does not have any concerning signs for meningitis. Given his associated infected bite mark we are going to treat with doxycycline to help cover for any tickborne illness as well and strict ER precautions for symptoms.  Discussed how to use this antibiotic along with common potential side effects and precautions.  At the end of our visit patient requested a work note stating that " he just did not feel like going to work today" which makes me more confident and not sending him to the emergency room for meningitis work-up. Final Clinical Impressions(s) / UC Diagnoses   Final diagnoses:  None   Discharge Instructions   None    ED Prescriptions   None    PDMP not reviewed this encounter.   Rushie Chestnut, PA-C 11/20/20 [address], PA-C 11/20/20 1104

## 2021-10-07 ENCOUNTER — Ambulatory Visit: Payer: 59 | Admitting: Internal Medicine

## 2021-10-07 ENCOUNTER — Encounter: Payer: Self-pay | Admitting: Internal Medicine

## 2021-10-07 VITALS — BP 128/80 | HR 60 | Temp 98.2°F | Ht 70.0 in | Wt 187.0 lb

## 2021-10-07 DIAGNOSIS — L02811 Cutaneous abscess of head [any part, except face]: Secondary | ICD-10-CM

## 2021-10-07 DIAGNOSIS — Z1159 Encounter for screening for other viral diseases: Secondary | ICD-10-CM

## 2021-10-07 NOTE — Patient Instructions (Signed)
Incision and Drainage Incision and drainage is a surgical procedure to open and drain a fluid-filled sac. The sac may be filled with pus, mucus, or blood. Examples of fluid-filled sacs that may need surgical drainage include cysts, skin infections (abscesses), and red lumps that develop from a ruptured cyst or a small abscess (boils). You may need this procedure if the affected area is large, painful, infected, or not healing well. Tell a health care provider about: Any allergies you have. All medicines you are taking, including vitamins, herbs, eye drops, creams, and over-the-counter medicines. Any problems you or family members have had with anesthetic medicines. Any blood disorders you have or have had. Any surgeries you have had. Any medical conditions you have or have had. Whether you are pregnant or may be pregnant. What are the risks? Generally, this is a safe procedure. However, problems may occur, including: Infection. Bleeding. Allergic reactions to medicines. Scarring. The cyst or abscess returns. Damage to nerves or vessels. What happens before the procedure? Medicine Ask your health care provider about: Changing or stopping your regular medicines. This is especially important if you are taking diabetes medicines or blood thinners. Taking medicines such as aspirin and ibuprofen. These medicines can thin your blood. Do not take these medicines unless your health care provider tells you to take them. Taking over-the-counter medicines, vitamins, herbs, and supplements. Tests You may have an exam or testing. These may include: Ultrasound or other imaging tests to see how large or deep the fluid-filled sac is. Blood tests to check for infection. General instructions Follow instructions from your health care provider about eating or drinking restrictions. Plan to have someone take you home from the hospital or clinic. Ask your health care provider whether a responsible adult  should care for you for at least 24 hours after you leave the hospital or clinic. This is important. You may get a tetanus shot. Ask your health care provider: How your surgery site will be marked or identified. What steps will be taken to help prevent infection. These may include: Removing hair at the surgery site. Washing skin with a germ-killing soap. Receiving antibiotic medicine. What happens during the procedure?  An IV may be inserted into one of your veins. You will be given one or more of the following: A medicine to help you relax (sedative). A medicine to numb the area (local anesthetic). A medicine to make you fall asleep (general anesthetic). An incision will be made in the top of the fluid-filled sac. Pus, blood, and mucus will be squeezed out, and a syringe or tube (drain) may be used to empty more fluid from the sac. Your health care provider will do one of the following. He or she may: Leave the drain in place for several weeks to drain more fluid. Stitch open the edges of the incision to make a long-term opening for drainage (marsupialization). The inside of the sac may be washed out (irrigated) with a sterile solution and packed with gauze before it is covered with a bandage (dressing). Your health care provider may do a culture test of the drainage fluid. The procedure may vary among health care providers and hospitals. What happens after the procedure? Your blood pressure, heart rate, breathing rate, and blood oxygen level will be monitored often until you leave the hospital or clinic. Do not drive for 24 hours if you were given a sedative during your procedure. Summary Incision and drainage is a surgical procedure to open and drain a fluid-filled   sac. The sac may be filled with pus, mucus, or blood. Before the procedure, you may be given antibiotic medicine to treat or help prevent infection. During the procedure, an incision will be made in the top of the  fluid-filled sac. Pus, blood, and mucus is squeezed out, and a syringe or tube (drain) may be used to empty more fluid from the sac. The inside of the sac may be washed out (irrigated) with a sterile solution and packed with gauze before it is covered with a bandage (dressing). This information is not intended to replace advice given to you by your health care provider. Make sure you discuss any questions you have with your health care provider. Document Revised: 02/13/2021 Document Reviewed: 02/13/2021 Elsevier Patient Education  2023 Elsevier Inc.  

## 2021-10-07 NOTE — Progress Notes (Unsigned)
Subjective:  Patient ID: Matthew Garcia., male    DOB: [birth year]  Age: 41 y.o. MRN: [id number]  CC: No chief complaint on file.   HPI Matthew Garcia. presents for f/up-  He complains of a one week hx of painful, swollen lump in the midline of his posterior scalp.  Outpatient Medications Prior to Visit  Medication Sig Dispense Refill   alfuzosin (UROXATRAL) 10 MG 24 hr tablet Take 10 mg by mouth at bedtime.     clomiPHENE (CLOMID) 50 MG tablet Take 25 mg by mouth daily.     doxycycline (VIBRAMYCIN) 100 MG capsule Take 1 capsule (100 mg total) by mouth 2 (two) times daily. 20 capsule 0   omeprazole (PRILOSEC) 40 MG capsule Take 1 capsule (40 mg total) by mouth daily. Before dinner 90 capsule 3   predniSONE (DELTASONE) 20 MG tablet Take 2 tablets (40 mg total) by mouth daily with breakfast. 6 tablet 0   testosterone cypionate (DEPOTESTOSTERONE CYPIONATE) 200 MG/ML injection SMARTSIG:0.5 Milliliter(s) IM Once a Week     triamcinolone cream (KENALOG) 0.1 % Apply 1 application topically 2 (two) times daily. 100 g 0   varenicline (CHANTIX CONTINUING MONTH PAK) 1 MG tablet Take 1 tablet (1 mg total) by mouth 2 (two) times daily. 60 tablet 3   varenicline (CHANTIX STARTING MONTH PAK) 0.5 MG X 11 & 1 MG X 42 tablet Take one 0.5 mg tablet by mouth once daily for 3 days, then increase to one 0.5 mg tablet twice daily for 4 days, then increase to one 1 mg tablet twice daily. 53 tablet 0   No facility-administered medications prior to visit.    ROS Review of Systems  All other systems reviewed and are negative.  Objective:  BP 128/80 (BP Location: Left Arm, Patient Position: Sitting, Cuff Size: Large) Comment: post I&D  Pulse 60   Temp 98.2 F (36.8 C) (Oral)   Ht 5\' 10"  (1.778 m)   Wt 187 lb (84.8 kg)   SpO2 98%   BMI 26.83 kg/m   BP Readings from Last 3 Encounters:  10/07/21 128/80  11/20/20 133/84  11/29/19 124/78    Wt Readings from Last 3 Encounters:  10/07/21 187  lb (84.8 kg)  11/29/19 197 lb (89.4 kg)  08/08/19 210 lb (95.3 kg)    Physical Exam HENT:     Head:     Lab Results  Component Value Date   WBC 5.9 11/23/2017   HGB 16.5 11/23/2017   HCT 47.1 11/23/2017   PLT 170.0 11/23/2017   GLUCOSE 104 (H) 07/06/2014   CHOL 134 08/26/2012   TRIG 83 08/26/2012   HDL 34 (L) 08/26/2012   LDLCALC 83 08/26/2012   ALT 19 08/26/2012   AST 20 08/26/2012   NA 140 07/06/2014   K 4.2 07/06/2014   CL 103 07/06/2014   CREATININE 0.93 07/06/2014   BUN 8 07/06/2014   CO2 31 07/06/2014   TSH 2.85 07/06/2014   PSA 0.31 04/20/2017    After informed verbal consent was obtained. Using Betadine for cleansing and 2% Lidocaine with epinephrine for anesthetic (2 cc's used), with sterile technique a 5 mm punch incision was made and a small cavity filled with sebaceous cyst was removed. There was no exudate. The cavity was cultured and irrigated with H2O2 and Qtips. No deep tracking or loculations were found. The cavity was packed with iodoform. Hemostasis was obtained by pressure. The specimen is labeled and sent. The procedure was  well tolerated without complications.   Assessment & Plan:   Diagnoses and all orders for this visit:  Abscess of scalp- There does not appear to be an infection. Most of the sebaceous cyst was removed. Will start antibiotics if the clx is positive. -     WOUND CULTURE; Future -     WOUND CULTURE   I have discontinued Waverly L. Levesque Jr. "Ronnie"'s omeprazole, varenicline, Chantix Starting Month Pak, triamcinolone cream, predniSONE, testosterone cypionate, and doxycycline. I am also having him maintain his alfuzosin and clomiPHENE.  No orders of the defined types were placed in this encounter.    Follow-up: Return in about 2 days (around 10/09/2021).  Sanda Linger, MD

## 2021-10-09 ENCOUNTER — Encounter: Payer: Self-pay | Admitting: Internal Medicine

## 2021-10-09 ENCOUNTER — Ambulatory Visit: Payer: 59 | Admitting: Internal Medicine

## 2021-10-09 VITALS — BP 122/78 | HR 60 | Temp 98.6°F | Ht 70.0 in | Wt 186.4 lb

## 2021-10-09 DIAGNOSIS — L02811 Cutaneous abscess of head [any part, except face]: Secondary | ICD-10-CM

## 2021-10-09 NOTE — Patient Instructions (Signed)

## 2021-10-09 NOTE — Progress Notes (Signed)
   Subjective:  Patient ID: Matthew Garcia., male    DOB: [birth year]  Age: 41 y.o. MRN: [id number]  CC: Wound Check   HPI Matthew Garcia. presents for f/up -  He is 2 days s/p I and D of a cyst over her posterior scalp.  Outpatient Medications Prior to Visit  Medication Sig Dispense Refill   alfuzosin (UROXATRAL) 10 MG 24 hr tablet Take 10 mg by mouth at bedtime.     clomiPHENE (CLOMID) 50 MG tablet Take 25 mg by mouth daily.     No facility-administered medications prior to visit.    ROS Review of Systems  All other systems reviewed and are negative.  Objective:  BP 122/78   Pulse 60   Temp 98.6 F (37 C) (Oral)   Ht 5\' 10"  (1.778 m)   Wt 186 lb 6.4 oz (84.6 kg)   SpO2 98%   BMI 26.75 kg/m   BP Readings from Last 3 Encounters:  10/09/21 122/78  10/07/21 128/80  11/20/20 133/84    Wt Readings from Last 3 Encounters:  10/09/21 186 lb 6.4 oz (84.6 kg)  10/07/21 187 lb (84.8 kg)  11/29/19 197 lb (89.4 kg)    Physical Exam HENT:     Head:     Lab Results  Component Value Date   WBC 5.9 11/23/2017   HGB 16.5 11/23/2017   HCT 47.1 11/23/2017   PLT 170.0 11/23/2017   GLUCOSE 104 (H) 07/06/2014   CHOL 134 08/26/2012   TRIG 83 08/26/2012   HDL 34 (L) 08/26/2012   LDLCALC 83 08/26/2012   ALT 19 08/26/2012   AST 20 08/26/2012   NA 140 07/06/2014   K 4.2 07/06/2014   CL 103 07/06/2014   CREATININE 0.93 07/06/2014   BUN 8 07/06/2014   CO2 31 07/06/2014   TSH 2.85 07/06/2014   PSA 0.31 04/20/2017    No results found.  Assessment & Plan:   Matthew Garcia was seen today for wound check.  Diagnoses and all orders for this visit:  Abscess of scalp- The clx is negative. Antibiotics are not indicated. The cyst has been removed.   I am having Matthew L. Danley Jr. "Ronnie" maintain his alfuzosin and clomiPHENE.  No orders of the defined types were placed in this encounter.    Follow-up: Return if symptoms worsen or fail to improve.  Windy Fast, MD

## 2021-10-11 LAB — WOUND CULTURE: RESULT:: NO GROWTH

## 2022-01-22 ENCOUNTER — Ambulatory Visit: Payer: 59 | Admitting: Internal Medicine

## 2022-01-23 ENCOUNTER — Ambulatory Visit: Payer: 59 | Admitting: Family Medicine

## 2022-01-23 ENCOUNTER — Encounter: Payer: Self-pay | Admitting: Family Medicine

## 2022-01-23 VITALS — BP 128/76 | HR 62 | Temp 97.8°F | Ht 70.0 in | Wt 192.0 lb

## 2022-01-23 DIAGNOSIS — B354 Tinea corporis: Secondary | ICD-10-CM

## 2022-01-23 MED ORDER — CLOTRIMAZOLE-BETAMETHASONE 1-0.05 % EX CREA: 1.0000 | TOPICAL_CREAM | Freq: Every day | CUTANEOUS | 0 refills | Status: DC

## 2022-01-23 NOTE — Progress Notes (Signed)
Subjective:     Patient ID: Matthew Garcia., male    DOB: June 02, 1980, 41 y.o.   MRN: [id number]  Chief Complaint  Patient presents with   Rash    Started off as 2 little itchy red bumps Saturday/Sunday then turned into a red circular rash as time went on. Has been using OTC ringworm cream which hasnt seemed to help, still itchy and present.     HPI Patient is in today for a circular rash on his right wrist x 5 days.  Using OTC antifungal. Itching is severe.   No fever, chills or other rash.   There are no preventive care reminders to display for this patient.  Past Medical History:  Diagnosis Date   Chicken pox    Erectile dysfunction    Heart murmur    Kidney stones    OSA (obstructive sleep apnea) 11/03/2016    Past Surgical History:  Procedure Laterality Date   CHOLECYSTECTOMY  12/09/2011   Procedure: LAPAROSCOPIC CHOLECYSTECTOMY;  Surgeon: Atilano Ina, MD,FACS;  Location: [facility];  Service: General;  Laterality: N/A;   CLAVICLE SURGERY     HERNIA REPAIR     SHOULDER SURGERY      Family History  Problem Relation Age of Onset   Arthritis Mother    Thyroid disease Mother    Fibromyalgia Mother    Other Mother        Bone Degenerative   Heart attack Father    Heart disease Maternal Grandmother    Stroke Paternal Grandmother    Healthy Paternal Grandfather    Thyroid disease Sister    Thyroid disease Sister    Lupus Sister    Anemia Sister    Diabetes Neg Hx    Colon cancer Neg Hx    Colon polyps Neg Hx    Esophageal cancer Neg Hx    Stomach cancer Neg Hx    Rectal cancer Neg Hx     Social History   Socioeconomic History   Marital status: Married    Spouse name: Armando Reichert   Number of children: 2   Years of education: 12   Highest education level: Not on file  Occupational History   Occupation: Psychologist, counselling  Tobacco Use   Smoking status: Light Smoker    Packs/day: 1.00    Years: 15.00    Total pack years: 15.00    Types: Cigars,  Cigarettes    Last attempt to quit: 08/16/2012    Years since quitting: 9.4   Smokeless tobacco: Never  Vaping Use   Vaping Use: Every day  Substance and Sexual Activity   Alcohol use: Yes    Comment: socially   Drug use: No   Sexual activity: Yes    Partners: Female    Birth control/protection: None  Other Topics Concern   Not on file  Social History Narrative   Born and raised in [place], [place]. Currently resides in a house. 1 dog 1 cat. Fun: Tinker with a jeep, hang out with his wife. Denies any religious beliefs that would effect health care.    Social Determinants of Health   Financial Resource Strain: Not on file  Food Insecurity: Not on file  Transportation Needs: Not on file  Physical Activity: Not on file  Stress: Not on file  Social Connections: Not on file  Intimate Partner Violence: Not on file    Outpatient Medications Prior to Visit  Medication Sig Dispense Refill   alfuzosin (UROXATRAL) 10  MG 24 hr tablet Take 10 mg by mouth at bedtime.     clomiPHENE (CLOMID) 50 MG tablet Take 25 mg by mouth daily.     No facility-administered medications prior to visit.    Allergies  Allergen Reactions   Hydrocodone Nausea Only    Hycodan Syrup   Penicillins Other (See Comments)    Unknown (as infant)    ROS     Objective:    Physical Exam  BP 128/76 (BP Location: Left Arm, Patient Position: Sitting, Cuff Size: Large)   Pulse 62   Temp 97.8 F (36.6 C) (Temporal)   Ht 5\' 10"  (1.778 m)   Wt 192 lb (87.1 kg)   SpO2 100%   BMI 27.55 kg/m  Wt Readings from Last 3 Encounters:  01/23/22 192 lb (87.1 kg)  10/09/21 186 lb 6.4 oz (84.6 kg)  10/07/21 187 lb (84.8 kg)     Alert and oriented. Circular pruritic, red rash, raised borders. No secondary bacterial infection.     Assessment & Plan:   Problem List Items Addressed This Visit   None Visit Diagnoses     Tinea corporis    -  Primary   Relevant Medications   clotrimazole-betamethasone (LOTRISONE)  cream      Use Lotrisone as prescribed. Avoid scratching, may keep covered. Discussed contagious nature.  Follow up as needed.   I am having Matthew L. Childers Jr. "Ronnie" start on clotrimazole-betamethasone. I am also having him maintain his alfuzosin and clomiPHENE.  Meds ordered this encounter  Medications   clotrimazole-betamethasone (LOTRISONE) cream    Sig: Apply 1 Application topically daily.    Dispense:  30 g    Refill:  0    Order Specific Question:   Supervising Provider    Answer:   10/09/21 A [[id number]]

## 2022-01-23 NOTE — Patient Instructions (Signed)
Body Ringworm Body ringworm is an infection of the skin that often causes a ring-shaped rash. Body ringworm is also called tinea corporis. Body ringworm can affect any part of your skin. This condition is easily spread from person to person (is very contagious). What are the causes? This condition is caused by fungi called dermatophytes. The condition develops when these fungi grow out of control on the skin. You can get this condition if you touch a person or animal that has it. You can also get it if you share any items with an infected person or pet. These include: Clothing, bedding, and towels. Brushes or combs. Gym equipment. Any other object that has the fungus on it. What increases the risk? You are more likely to develop this condition if you: Play sports that involve close physical contact, such as wrestling. Sweat a lot. Live in areas that are hot and humid. Use public showers. Have a weakened immune system. What are the signs or symptoms? Symptoms of this condition include: Itchy, raised red spots and bumps. Red scaly patches. A ring-shaped rash. The rash may have: A clear center. Scales or red bumps at its center. Redness near its borders. Dry and scaly skin on or around it. How is this diagnosed? This condition can usually be diagnosed with a skin exam. A skin scraping may be taken from the affected area and examined under a microscope to see if the fungus is present. How is this treated? This condition may be treated with: An antifungal cream or ointment. An antifungal shampoo. Antifungal medicines. These may be prescribed if your ringworm: Is severe. Keeps coming back. Lasts a long time. Follow these instructions at home: Take over-the-counter and prescription medicines only as told by your health care provider. If you were given an antifungal cream or ointment: Use it as told by your health care provider. Wash the infected area and dry it completely before  applying the cream or ointment. If you were given an antifungal shampoo: Use it as told by your health care provider. Leave the shampoo on your body for 3-5 minutes before rinsing. While you have a rash: Wear loose clothing to stop clothes from rubbing and irritating it. Wash or change your bed sheets every night. Disinfect or throw out items that may be infected. Wash clothes and bed sheets in hot water. Wash your hands often with soap and water. If soap and water are not available, use hand sanitizer. If your pet has the same infection, take your pet to see a veterinarian for treatment. How is this prevented? Take a bath or shower every day and after every time you work out or play sports. Dry your skin completely after bathing. Wear sandals or shoes in public places and showers. Change your clothes every day. Wash athletic clothes after each use. Do not share personal items with others. Avoid touching red patches of skin on other people. Avoid touching pets that have bald spots. If you touch an animal that has a bald spot, wash your hands. Contact a health care provider if: Your rash continues to spread after 7 days of treatment. Your rash is not gone in 4 weeks. The area around your rash gets red, warm, tender, and swollen. Summary Body ringworm is an infection of the skin that often causes a ring-shaped rash. This condition is easily spread from person to person (is very contagious). This condition may be treated with antifungal cream or ointment, antifungal shampoo, or antifungal medicines. Take over-the-counter and   prescription medicines only as told by your health care provider. This information is not intended to replace advice given to you by your health care provider. Make sure you discuss any questions you have with your health care provider. Document Revised: 07/16/2021 Document Reviewed: 02/25/2021 Elsevier Patient Education  2023 Elsevier Inc.  

## 2022-02-18 ENCOUNTER — Ambulatory Visit: Payer: 59 | Admitting: Internal Medicine

## 2022-02-18 ENCOUNTER — Other Ambulatory Visit: Payer: Self-pay | Admitting: Internal Medicine

## 2022-02-18 ENCOUNTER — Encounter: Payer: Self-pay | Admitting: Internal Medicine

## 2022-02-18 VITALS — BP 124/68 | HR 62 | Temp 98.2°F | Ht 70.0 in | Wt 194.0 lb

## 2022-02-18 DIAGNOSIS — Z91038 Other insect allergy status: Secondary | ICD-10-CM | POA: Insufficient documentation

## 2022-02-18 DIAGNOSIS — Z23 Encounter for immunization: Secondary | ICD-10-CM | POA: Diagnosis not present

## 2022-02-18 MED ORDER — METHYLPREDNISOLONE ACETATE 80 MG/ML IJ SUSP
120.0000 mg | Freq: Once | INTRAMUSCULAR | Status: AC
  Administered 2022-02-18: 120 mg via INTRAMUSCULAR

## 2022-02-18 MED ORDER — LEVOCETIRIZINE DIHYDROCHLORIDE 5 MG PO TABS: 5.0000 mg | ORAL_TABLET | Freq: Every evening | ORAL | 0 refills | Status: DC

## 2022-02-18 NOTE — Progress Notes (Signed)
Subjective:  Patient ID: Matthew Garcia., male    DOB: Jun 27, 1980  Age: 41 y.o. MRN: [id number]  CC: Rash   HPI Jahaan Vanwagner. presents for rash -  He complains of a 1 day history of itchy rash with lesions on his neck, torso, genitals, and lower extremities.  He describes the areas as red and nonpainful.  He noticed the lesion after he had been hiking in fields with his dog.  Outpatient Medications Prior to Visit  Medication Sig Dispense Refill   alfuzosin (UROXATRAL) 10 MG 24 hr tablet Take 10 mg by mouth at bedtime.     clomiPHENE (CLOMID) 50 MG tablet Take 25 mg by mouth daily.     clotrimazole-betamethasone (LOTRISONE) cream Apply 1 Application topically daily. 30 g 0   No facility-administered medications prior to visit.    ROS Review of Systems  Constitutional: Negative.  Negative for diaphoresis and fatigue.  HENT: Negative.    Eyes: Negative.   Respiratory:  Negative for cough, chest tightness and wheezing.   Cardiovascular:  Negative for chest pain, palpitations and leg swelling.  Gastrointestinal:  Negative for abdominal pain, diarrhea and nausea.  Endocrine: Negative.   Genitourinary: Negative.  Negative for difficulty urinating.  Skin:  Positive for rash.  Neurological: Negative.   Hematological:  Negative for adenopathy. Does not bruise/bleed easily.  Psychiatric/Behavioral: Negative.  Negative for sleep disturbance. The patient is not nervous/anxious.     Objective:  BP 124/68 (BP Location: Left Arm, Patient Position: Sitting, Cuff Size: Large)   Pulse 62   Temp 98.2 F (36.8 C) (Oral)   Ht 5\' 10"  (1.778 m)   Wt 194 lb (88 kg)   SpO2 98%   BMI 27.84 kg/m   BP Readings from Last 3 Encounters:  02/18/22 124/68  01/23/22 128/76  10/09/21 122/78    Wt Readings from Last 3 Encounters:  02/18/22 194 lb (88 kg)  01/23/22 192 lb (87.1 kg)  10/09/21 186 lb 6.4 oz (84.6 kg)    Physical Exam Vitals reviewed.  Constitutional:       Appearance: He is not ill-appearing.  HENT:     Nose: Nose normal.     Mouth/Throat:     Mouth: Mucous membranes are moist.  Eyes:     General: No scleral icterus.    Conjunctiva/sclera: Conjunctivae normal.  Cardiovascular:     Rate and Rhythm: Normal rate and regular rhythm.     Heart sounds: No murmur heard. Pulmonary:     Effort: Pulmonary effort is normal.     Breath sounds: No stridor. No wheezing, rhonchi or rales.  Abdominal:     General: Abdomen is flat.     Palpations: There is no mass.     Tenderness: There is no abdominal tenderness. There is no guarding.     Hernia: No hernia is present.  Musculoskeletal:     Cervical back: Neck supple.  Lymphadenopathy:     Cervical: No cervical adenopathy.  Skin:    General: Skin is warm and dry.     Findings: Erythema and rash present.     Comments: There are about 10 erythematous papules on his posterior torso, neck, and lower extremities.  They are not umbilicated.  Neurological:     General: No focal deficit present.     Lab Results  Component Value Date   WBC 5.9 11/23/2017   HGB 16.5 11/23/2017   HCT 47.1 11/23/2017   PLT 170.0 11/23/2017  GLUCOSE 104 (H) 07/06/2014   CHOL 134 08/26/2012   TRIG 83 08/26/2012   HDL 34 (L) 08/26/2012   LDLCALC 83 08/26/2012   ALT 19 08/26/2012   AST 20 08/26/2012   NA 140 07/06/2014   K 4.2 07/06/2014   CL 103 07/06/2014   CREATININE 0.93 07/06/2014   BUN 8 07/06/2014   CO2 31 07/06/2014   TSH 2.85 07/06/2014   PSA 0.31 04/20/2017    No results found.  Assessment & Plan:   Ward was seen today for rash.  Diagnoses and all orders for this visit:  Allergic reaction to insect bite- This is consistent with chigger bites.  Will treat with an injection of methylprednisolone and a systemic antihistamine. -     levocetirizine (XYZAL) 5 MG tablet; Take 1 tablet (5 mg total) by mouth every evening. -     methylPREDNISolone acetate (DEPO-MEDROL) injection 120 mg  Other  orders -     Flu Vaccine QUAD 6+ mos PF IM (Fluarix Quad PF)   I am having Gregg L. Hargan Jr. "Ronnie" start on levocetirizine. I am also having him maintain his alfuzosin, clomiPHENE, and clotrimazole-betamethasone. We administered methylPREDNISolone acetate.  Meds ordered this encounter  Medications   levocetirizine (XYZAL) 5 MG tablet    Sig: Take 1 tablet (5 mg total) by mouth every evening.    Dispense:  30 tablet    Refill:  0   methylPREDNISolone acetate (DEPO-MEDROL) injection 120 mg     Follow-up: Return if symptoms worsen or fail to improve.  Sanda Linger, MD

## 2022-02-18 NOTE — Patient Instructions (Signed)
Insect Bite, Adult An insect bite can make your skin red, itchy, and swollen. An insect bite is different from an insect sting, which happens when an insect injects poison (venom) into the skin. Some insects can spread disease to people through a bite. However, most insect bites do not lead to disease and are not serious. What are the causes? Insects may bite for a variety of reasons, including: Hunger. To defend themselves. Insects that bite include: Spiders. Mosquitoes and flies. Ticks and fleas. Ants. Kissing bugs. Chiggers. What are the signs or symptoms? In many cases, symptoms last for 2-4 days. However, itching can last up to 10 days. Symptoms include: Itching or pain in the bite area. Redness and swelling in the bite area. An open wound (skin ulcer). In rare cases, a person may have a severe allergic reaction (anaphylactic reaction) to a bite. Symptoms of an anaphylactic reaction may include: Feeling warm in the face (flushed). This may include redness. Itchy, red, swollen areas of skin (hives). Swelling of the eyes, lips, face, mouth, tongue, or throat. Wheezing or difficulty breathing, speaking, or swallowing. Dizziness, light-headedness, or fainting. Abdominal symptoms like cramping, nausea, vomiting, or diarrhea. How is this diagnosed? This condition is usually diagnosed based on symptoms and a physical exam. During the exam, your health care provider will look at the bite and ask you what kind of insect bit you. How is this treated? Most insect bites are not serious. Symptoms often go away on their own and treatment is not usually needed. When treatment is recommended, it may include: Applying ice to the affected area. Applying steroid or other anti-itch creams, like calamine lotion, to the bite area. Medicines called antihistamines to reduce itching. You may also need: A tetanus shot if you are not up to date. Antibiotic cream or an oral antibiotic if the bite becomes  infected (this is uncommon). Follow these instructions at home: Bite area care  Do not scratch the bite area. It may help to cover the bite area with a bandage or close-fitting clothing. Keep the bite area clean and dry. Wash it every day with soap and water as told by your health care provider. Check the bite area every day for signs of infection. Check for: More redness, swelling, or pain. Fluid or blood. Warmth. Pus or a bad smell. Managing pain, itching, and swelling  You may apply cortisone cream, calamine lotion, or a paste made of baking soda and water to the bite area as told by your health care provider. If directed, put ice on the bite area. To do this: Put ice in a plastic bag. Place a towel between your skin and the bag. Leave the ice on for 20 minutes, 2-3 times a day. If your skin turns bright red, remove the ice right away to prevent skin damage. The risk of skin damage is higher if you cannot feel pain, heat, or cold. General instructions Apply or take over-the-counter and prescription medicine only as told by your health care provider. If you were prescribed antibiotics, take or apply them as told by your health care provider. Do not stop using the antibiotic even if you start to feel better. How is this prevented? To help reduce your risk of insect bites: When you are outdoors, wear clothing that covers your arms and legs. This is especially important in the early morning and evening. Use insect repellent. The best insect repellents contain DEET, picaridin, oil of lemon eucalyptus (OLE), or IR3535. Consider spraying your clothing   with a pesticide called permethrin. Permethrin helps prevent insect bites. It works for several weeks and for up to 5-6 clothing washes. Do not apply permethrin directly to the skin. If your home windows do not have screens, consider installing them. If you will be sleeping in an area where there are mosquitoes, consider covering your sleeping  area with a mosquito net. Contact a health care provider if: Your bite area has signs of infection, such as: More redness, swelling, or pain. Fluid or blood. Warmth. Pus or a bad smell. You have a fever. Get help right away if: You have a rash. You have muscle or joint pain. You feel unusually tired or weak. You have neck pain or a headache. You develop symptoms of an anaphylactic reaction. These may include: Swelling of the eyes, lips, face, mouth, tongue, or throat. Flushed skin or hives. Wheezing. Difficulty breathing, speaking, or swallowing. Dizziness, light-headedness, or fainting. Abdominal pain, cramping, vomiting, or diarrhea. These symptoms may be an emergency. Get help right away. Call 911. Do not wait to see if the symptoms will go away. Do not drive yourself to the hospital. Summary An insect bite can make your skin red, itchy, and swollen. Treatment is usually not needed. Symptoms often go away on their own. When treatment is recommended, it may involve taking medicine, applying medicine to the area, or applying ice. Apply or take over-the-counter and prescription medicines only as told by your health care provider. Use insect repellent to help prevent insect bites. Contact a health care provider if your bite area has signs of infection. This information is not intended to replace advice given to you by your health care provider. Make sure you discuss any questions you have with your health care provider. Document Revised: 08/13/2021 Document Reviewed: 07/29/2021 Elsevier Patient Education  2023 Elsevier Inc.  

## 2022-06-09 ENCOUNTER — Ambulatory Visit: Payer: 59 | Admitting: Internal Medicine

## 2022-06-09 ENCOUNTER — Encounter: Payer: Self-pay | Admitting: Internal Medicine

## 2022-06-09 VITALS — BP 120/80 | HR 55 | Temp 98.5°F | Ht 70.0 in | Wt 201.0 lb

## 2022-06-09 DIAGNOSIS — M7712 Lateral epicondylitis, left elbow: Secondary | ICD-10-CM | POA: Diagnosis not present

## 2022-06-09 DIAGNOSIS — M7711 Lateral epicondylitis, right elbow: Secondary | ICD-10-CM | POA: Diagnosis not present

## 2022-06-09 MED ORDER — PREDNISONE 20 MG PO TABS: 40.0000 mg | ORAL_TABLET | Freq: Every day | ORAL | 0 refills | Status: DC

## 2022-06-09 NOTE — Progress Notes (Signed)
   Subjective:   Patient ID: Matthew Haring., male    DOB: 01-Oct-1980, 42 y.o.   MRN: [id number]  HPI The patient is a 42 YO man coming in for elbow pain 1-2 weeks  Review of Systems  Constitutional: Negative.   HENT: Negative.    Eyes: Negative.   Respiratory:  Negative for cough, chest tightness and shortness of breath.   Cardiovascular:  Negative for chest pain, palpitations and leg swelling.  Gastrointestinal:  Negative for abdominal distention, abdominal pain, constipation, diarrhea, nausea and vomiting.  Musculoskeletal:  Positive for myalgias.  Skin: Negative.   Neurological: Negative.   Psychiatric/Behavioral: Negative.      Objective:  Physical Exam Constitutional:      Appearance: He is well-developed.  HENT:     Head: Normocephalic and atraumatic.  Cardiovascular:     Rate and Rhythm: Normal rate and regular rhythm.  Pulmonary:     Effort: Pulmonary effort is normal. No respiratory distress.     Breath sounds: Normal breath sounds. No wheezing or rales.  Abdominal:     General: Bowel sounds are normal. There is no distension.     Palpations: Abdomen is soft.     Tenderness: There is no abdominal tenderness. There is no rebound.  Musculoskeletal:     Cervical back: Normal range of motion.     Comments: Tenderness at tendon insertion bilateral elbow.   Skin:    General: Skin is warm and dry.  Neurological:     Mental Status: He is alert and oriented to person, place, and time.     Coordination: Coordination normal.     Vitals:   06/09/22 1534  BP: 120/80  Pulse: (!) 55  Temp: 98.5 F (36.9 C)  TempSrc: Oral  SpO2: 95%  Weight: 201 lb (91.2 kg)  Height: 5\' 10"  (1.778 m)    Assessment & Plan:

## 2022-06-09 NOTE — Patient Instructions (Signed)
We have sent in prednisone to take 2 pills daily for 5 days tohelp this. If it does not improve or comes back let us know we will get you in with sports medicine.

## 2022-06-12 DIAGNOSIS — M7710 Lateral epicondylitis, unspecified elbow: Secondary | ICD-10-CM | POA: Insufficient documentation

## 2022-06-12 NOTE — Assessment & Plan Note (Signed)
Rx prednisone 5 day course. If no improvement NSAIDs 2 weeks. Started about 1-2 weeks ago and has not tried anything otc.

## 2023-05-27 ENCOUNTER — Encounter: Payer: 59 | Admitting: Internal Medicine

## 2023-09-24 ENCOUNTER — Encounter (HOSPITAL_COMMUNITY): Payer: Self-pay

## 2023-09-30 NOTE — Patient Instructions (Incomplete)
 We have completed your physical today.   We are checking labs today, will be in contact with any results that require further attention  Follow up in a year for your next physical.

## 2023-09-30 NOTE — Progress Notes (Deleted)
 Complete physical exam  Patient: Matthew Garcia.   DOB: 1981/02/15   43 y.o. Male  MRN: [id number]  Subjective:     No chief complaint on file.   Ramero Stankevich. is a 43 y.o. male who presents today for a complete physical exam. He reports consuming a {diet types:17450} diet. {types:19826} He generally feels {DESC; WELL/FAIRLY WELL/POORLY:18703}. He reports sleeping {DESC; WELL/FAIRLY WELL/POORLY:18703}. He {does/does not:200015} have additional problems to discuss today.    Most recent fall risk assessment:    06/09/2022    3:40 PM  Fall Risk   Falls in the past year? 0  Number falls in past yr: 0  Injury with Fall? 0  Follow up Falls evaluation completed     Most recent depression screenings:    06/09/2022    3:40 PM 01/23/2022   10:04 AM  PHQ 2/9 Scores  PHQ - 2 Score 0 0  PHQ- 9 Score 0     {VISON DENTAL STD PSA (Optional):27386}  {History (Optional):23778}  Patient Care Team: Arcadio Knuckles, MD as PCP - General (Internal Medicine)   Outpatient Medications Prior to Visit  Medication Sig  . alfuzosin (UROXATRAL) 10 MG 24 hr tablet Take 10 mg by mouth at bedtime.  . clomiPHENE  (CLOMID ) 50 MG tablet Take 25 mg by mouth daily.  . clotrimazole -betamethasone  (LOTRISONE ) cream Apply 1 Application topically daily.  Aaron Aas levocetirizine (XYZAL ) 5 MG tablet Take 1 tablet (5 mg total) by mouth every evening.  . predniSONE  (DELTASONE ) 20 MG tablet Take 2 tablets (40 mg total) by mouth daily with breakfast.   No facility-administered medications prior to visit.    ROS        Objective:     There were no vitals taken for this visit. {Vitals History (Optional):23777}  Physical Exam Vitals and nursing note reviewed.  Constitutional:      General: He is not in acute distress.    Appearance: Normal appearance.  HENT:     Head: Normocephalic and atraumatic.     Right Ear: External ear normal.     Left Ear: External ear normal.     Nose: Nose normal.      Mouth/Throat:     Mouth: Mucous membranes are moist.     Pharynx: Oropharynx is clear.  Eyes:     Extraocular Movements: Extraocular movements intact.  Cardiovascular:     Rate and Rhythm: Normal rate and regular rhythm.     Pulses: Normal pulses.     Heart sounds: Normal heart sounds.  Pulmonary:     Effort: Pulmonary effort is normal. No respiratory distress.     Breath sounds: Normal breath sounds. No wheezing, rhonchi or rales.  Musculoskeletal:        General: Normal range of motion.     Cervical back: Normal range of motion.     Right lower leg: No edema.     Left lower leg: No edema.  Lymphadenopathy:     Cervical: No cervical adenopathy.  Skin:    General: Skin is warm and dry.  Neurological:     General: No focal deficit present.     Mental Status: He is alert and oriented to person, place, and time.  Psychiatric:        Mood and Affect: Mood normal.        Behavior: Behavior normal.     No results found for any visits on 10/01/23. {Show previous labs (optional):23779}     Assessment &  Plan:    Routine Health Maintenance and Physical Exam   Health Maintenance  Topic Date Due  . Hepatitis C Screening  Never done  . Flu Shot  12/17/2023  . DTaP/Tdap/Td vaccine (2 - Tdap) 07/15/2024  . HIV Screening  Completed  . HPV Vaccine  Aged Out  . Meningitis B Vaccine  Aged Out  . COVID-19 Vaccine  Discontinued     Discussed health benefits of physical activity, and encouraged him to engage in regular exercise appropriate for his age and condition.  There are no diagnoses linked to this encounter.   No follow-ups on file.     Wellington Half, FNP

## 2023-10-01 ENCOUNTER — Encounter: Payer: Self-pay | Admitting: Internal Medicine

## 2023-10-01 ENCOUNTER — Ambulatory Visit: Admitting: Family Medicine

## 2023-10-01 DIAGNOSIS — Z720 Tobacco use: Secondary | ICD-10-CM

## 2023-10-01 DIAGNOSIS — Z136 Encounter for screening for cardiovascular disorders: Secondary | ICD-10-CM

## 2023-10-01 DIAGNOSIS — Z0000 Encounter for general adult medical examination without abnormal findings: Secondary | ICD-10-CM

## 2023-10-01 NOTE — Telephone Encounter (Signed)
 Patient showed up and stated that he did not use his my chart and he did not have the same phone number.  Patient was told that it was rescheduled and he will let us  know if he can make the scheduled appointment.

## 2023-10-28 ENCOUNTER — Encounter: Admitting: Internal Medicine

## 2024-01-03 ENCOUNTER — Ambulatory Visit: Payer: Self-pay | Admitting: *Deleted

## 2024-01-03 NOTE — Progress Notes (Unsigned)
    Subjective:    Patient ID: Matthew Garcia., male    DOB: [birth year], 43 y.o.   MRN: [id number]      HPI Matthew Garcia is here for No chief complaint on file.   Elevated BP -      Medications and allergies reviewed with patient and updated if appropriate.  Current Outpatient Medications on File Prior to Visit  Medication Sig Dispense Refill   alfuzosin (UROXATRAL) 10 MG 24 hr tablet Take 10 mg by mouth at bedtime.     clomiPHENE  (CLOMID ) 50 MG tablet Take 25 mg by mouth daily.     clotrimazole -betamethasone  (LOTRISONE ) cream Apply 1 Application topically daily. 30 g 0   levocetirizine (XYZAL ) 5 MG tablet Take 1 tablet (5 mg total) by mouth every evening. 30 tablet 0   predniSONE  (DELTASONE ) 20 MG tablet Take 2 tablets (40 mg total) by mouth daily with breakfast. 10 tablet 0   No current facility-administered medications on file prior to visit.    Review of Systems     Objective:  There were no vitals filed for this visit. BP Readings from Last 3 Encounters:  06/09/22 120/80  02/18/22 124/68  01/23/22 128/76   Wt Readings from Last 3 Encounters:  06/09/22 201 lb (91.2 kg)  02/18/22 194 lb (88 kg)  01/23/22 192 lb (87.1 kg)   There is no height or weight on file to calculate BMI.    Physical Exam         Assessment & Plan:    See Problem List for Assessment and Plan of chronic medical problems.

## 2024-01-03 NOTE — Telephone Encounter (Signed)
 FYI Only or Action Required?: FYI only for provider.  Patient was last seen in primary care on 06/09/2022 by Rollene Almarie LABOR, MD.  Called Nurse Triage reporting Hypertension.  Symptoms began several days ago.  Interventions attempted: Nothing.  Symptoms are: unchanged.  Triage Disposition: See PCP Within 2 Weeks  Patient/caregiver understands and will follow disposition?: yes   Reason for Disposition  [1] Systolic BP >= 130 OR Diastolic >= 80 AND [2] taking BP medications  Answer Assessment - Initial Assessment Questions 1. BLOOD PRESSURE: What is your blood pressure? Did you take at least two measurements 5 minutes apart?     139/88 2. ONSET: When did you take your blood pressure?     Today- 3:45 3. HOW: How did you take your blood pressure? (e.g., automatic home BP monitor, visiting nurse)     Automatic cuff- wrist 4. HISTORY: Do you have a history of high blood pressure?     no 5. MEDICINES: Are you taking any medicines for blood pressure? Have you missed any doses recently?     no 6. OTHER SYMPTOMS: Do you have any symptoms? (e.g., blurred vision, chest pain, difficulty breathing, headache, weakness)       Occasional headache  Protocols used: Blood Pressure - High-A-AH   Copied from CRM #[id number]. Topic: Clinical - Red Word Triage >> Jan 03, 2024  3:59 PM Chasity T wrote: [facility] that prompted transfer to Nurse Triage: patient is calling in for high blood pressure. Past few readings:  160/114 - Aug 16th 158-102 - Aug 17th 130/84   139/88 - today

## 2024-01-04 ENCOUNTER — Encounter: Payer: Self-pay | Admitting: Internal Medicine

## 2024-01-04 ENCOUNTER — Ambulatory Visit: Admitting: Internal Medicine

## 2024-01-04 VITALS — BP 132/78 | HR 78 | Temp 98.3°F | Ht 70.0 in | Wt 220.0 lb

## 2024-01-04 DIAGNOSIS — I10 Essential (primary) hypertension: Secondary | ICD-10-CM | POA: Diagnosis not present

## 2024-01-04 LAB — CBC WITH DIFFERENTIAL/PLATELET
Basophils Absolute: 0.1 K/uL (ref 0.0–0.1)
Basophils Relative: 1.1 % (ref 0.0–3.0)
Eosinophils Absolute: 0.1 K/uL (ref 0.0–0.7)
Eosinophils Relative: 1.6 % (ref 0.0–5.0)
HCT: 45.1 % (ref 39.0–52.0)
Hemoglobin: 15.3 g/dL (ref 13.0–17.0)
Lymphocytes Relative: 28.2 % (ref 12.0–46.0)
Lymphs Abs: 1.9 K/uL (ref 0.7–4.0)
MCHC: 34 g/dL (ref 30.0–36.0)
MCV: 88.7 fl (ref 78.0–100.0)
Monocytes Absolute: 0.5 K/uL (ref 0.1–1.0)
Monocytes Relative: 7.1 % (ref 3.0–12.0)
Neutro Abs: 4.2 K/uL (ref 1.4–7.7)
Neutrophils Relative %: 62 % (ref 43.0–77.0)
Platelets: 157 K/uL (ref 150.0–400.0)
RBC: 5.08 Mil/uL (ref 4.22–5.81)
RDW: 13.1 % (ref 11.5–15.5)
WBC: 6.7 K/uL (ref 4.0–10.5)

## 2024-01-04 LAB — COMPREHENSIVE METABOLIC PANEL WITH GFR
ALT: 20 U/L (ref 0–53)
AST: 20 U/L (ref 0–37)
Albumin: 4.6 g/dL (ref 3.5–5.2)
Alkaline Phosphatase: 39 U/L (ref 39–117)
BUN: 9 mg/dL (ref 6–23)
CO2: 27 meq/L (ref 19–32)
Calcium: 9.2 mg/dL (ref 8.4–10.5)
Chloride: 102 meq/L (ref 96–112)
Creatinine, Ser: 1.01 mg/dL (ref 0.40–1.50)
GFR: 90.11 mL/min (ref >60.00)
Glucose, Bld: 98 mg/dL (ref 70–99)
Potassium: 3.9 meq/L (ref 3.5–5.1)
Sodium: 137 meq/L (ref 135–145)
Total Bilirubin: 0.8 mg/dL (ref 0.2–1.2)
Total Protein: 7 g/dL (ref 6.0–8.3)

## 2024-01-04 LAB — LIPID PANEL
Cholesterol: 193 mg/dL (ref 0–200)
HDL: 35.2 mg/dL — ABNORMAL LOW (ref >39.00)
LDL Cholesterol: 131 mg/dL — ABNORMAL HIGH (ref 0–99)
NonHDL: 157.61
Total CHOL/HDL Ratio: 5
Triglycerides: 134 mg/dL (ref 0.0–149.0)
VLDL: 26.8 mg/dL (ref 0.0–40.0)

## 2024-01-04 LAB — TSH: TSH: 3.69 u[IU]/mL (ref 0.35–5.50)

## 2024-01-04 MED ORDER — AMLODIPINE BESYLATE 5 MG PO TABS: 5.0000 mg | ORAL_TABLET | Freq: Every day | ORAL | 1 refills | Status: DC

## 2024-01-04 NOTE — Patient Instructions (Addendum)
   Normal BP 100-120/60-80 BP goal < 130/80   Blood work was ordered.       Medications changes include :   amlodipine  5 mg daily      Return in about 4 weeks (around 02/01/2024) for 3-4 weeks Physical Exam with PCP if possible.

## 2024-01-06 ENCOUNTER — Ambulatory Visit: Payer: Self-pay | Admitting: Internal Medicine

## 2024-01-18 ENCOUNTER — Ambulatory Visit: Payer: Self-pay

## 2024-01-18 NOTE — Telephone Encounter (Signed)
 FYI Only or Action Required?: Action required by provider: clinical question for provider.  Patient was last seen in primary care on 01/04/2024 by Geofm Glade PARAS, MD.  Called Nurse Triage reporting Medication Reaction.  Symptoms began since starting the medication.  Interventions attempted: Nothing.  Symptoms are: unchanged.  Triage Disposition: Call PCP Now  Patient/caregiver understands and will follow disposition?: yes  Copied from CRM #[id number]. Topic: Clinical - Red Word Triage >> Jan 18, 2024 12:25 PM Shereese L wrote: [facility] that prompted transfer to Nurse Triage: Side effects from medication amLODipine  (NORVASC ) 5 MG tablet Body cold, pressure behind eyes, body aches, blurry vision, anxiety goes up Reason for Disposition  [1] Caller has URGENT medicine question about med that primary care doctor (or NP/PA) or specialist prescribed AND [2] triager unable to answer question  Answer Assessment - Initial Assessment Questions 1. NAME of MEDICINE: What medicine(s) are you calling about?     amlodipine  2. QUESTION: What is your question? (e.g., double dose of medicine, side effect)     Cold body, pressure behind eyes, body aches, blurry vision, anxiety. States that he is not having any of the s/s currently, states they start shortly after taking the medication 3. PRESCRIBER: Who prescribed the medicine? Reason: if prescribed by specialist, call should be referred to that group.     PCP  Protocols used: Medication Question Call-A-AH

## 2024-01-18 NOTE — Telephone Encounter (Signed)
 Stop amlodipine .  Start telmisartan  20 mg daily   What is his bp running

## 2024-01-19 ENCOUNTER — Other Ambulatory Visit: Payer: Self-pay

## 2024-01-19 MED ORDER — TELMISARTAN 20 MG PO TABS: 20.0000 mg | ORAL_TABLET | Freq: Every day | ORAL | 1 refills | Status: AC

## 2024-01-28 DIAGNOSIS — R350 Frequency of micturition: Secondary | ICD-10-CM | POA: Diagnosis not present

## 2024-01-28 DIAGNOSIS — R351 Nocturia: Secondary | ICD-10-CM | POA: Diagnosis not present

## 2024-01-28 DIAGNOSIS — N401 Enlarged prostate with lower urinary tract symptoms: Secondary | ICD-10-CM | POA: Diagnosis not present

## 2024-01-28 DIAGNOSIS — E291 Testicular hypofunction: Secondary | ICD-10-CM | POA: Diagnosis not present

## 2024-02-02 ENCOUNTER — Ambulatory Visit (INDEPENDENT_AMBULATORY_CARE_PROVIDER_SITE_OTHER): Admitting: Internal Medicine

## 2024-02-02 ENCOUNTER — Encounter: Payer: Self-pay | Admitting: Internal Medicine

## 2024-02-02 VITALS — BP 118/78 | HR 67 | Temp 98.6°F | Resp 16 | Ht 70.0 in | Wt 216.2 lb

## 2024-02-02 DIAGNOSIS — I10 Essential (primary) hypertension: Secondary | ICD-10-CM | POA: Diagnosis not present

## 2024-02-02 DIAGNOSIS — Z23 Encounter for immunization: Secondary | ICD-10-CM

## 2024-02-02 DIAGNOSIS — Z1211 Encounter for screening for malignant neoplasm of colon: Secondary | ICD-10-CM | POA: Insufficient documentation

## 2024-02-02 DIAGNOSIS — Z0001 Encounter for general adult medical examination with abnormal findings: Secondary | ICD-10-CM | POA: Diagnosis not present

## 2024-02-02 NOTE — Patient Instructions (Addendum)
 You received your first Hepatitis B vaccine today. Please return for the second dose in one month.   Hypertension, Adult High blood pressure (hypertension) is when the force of blood pumping through the arteries is too strong. The arteries are the blood vessels that carry blood from the heart throughout the body. Hypertension forces the heart to work harder to pump blood and may cause arteries to become narrow or stiff. Untreated or uncontrolled hypertension can lead to a heart attack, heart failure, a stroke, kidney disease, and other problems. A blood pressure reading consists of a higher number over a lower number. Ideally, your blood pressure should be below 120/80. The first (top) number is called the systolic pressure. It is a measure of the pressure in your arteries as your heart beats. The second (bottom) number is called the diastolic pressure. It is a measure of the pressure in your arteries as the heart relaxes. What are the causes? The exact cause of this condition is not known. There are some conditions that result in high blood pressure. What increases the risk? Certain factors may make you more likely to develop high blood pressure. Some of these risk factors are under your control, including: Smoking. Not getting enough exercise or physical activity. Being overweight. Having too much fat, sugar, calories, or salt (sodium) in your diet. Drinking too much alcohol. Other risk factors include: Having a personal history of heart disease, diabetes, high cholesterol, or kidney disease. Stress. Having a family history of high blood pressure and high cholesterol. Having obstructive sleep apnea. Age. The risk increases with age. What are the signs or symptoms? High blood pressure may not cause symptoms. Very high blood pressure (hypertensive crisis) may cause: Headache. Fast or irregular heartbeats (palpitations). Shortness of breath. Nosebleed. Nausea and vomiting. Vision  changes. Severe chest pain, dizziness, and seizures. How is this diagnosed? This condition is diagnosed by measuring your blood pressure while you are seated, with your arm resting on a flat surface, your legs uncrossed, and your feet flat on the floor. The cuff of the blood pressure monitor will be placed directly against the skin of your upper arm at the level of your heart. Blood pressure should be measured at least twice using the same arm. Certain conditions can cause a difference in blood pressure between your right and left arms. If you have a high blood pressure reading during one visit or you have normal blood pressure with other risk factors, you may be asked to: Return on a different day to have your blood pressure checked again. Monitor your blood pressure at home for 1 week or longer. If you are diagnosed with hypertension, you may have other blood or imaging tests to help your health care provider understand your overall risk for other conditions. How is this treated? This condition is treated by making healthy lifestyle changes, such as eating healthy foods, exercising more, and reducing your alcohol intake. You may be referred for counseling on a healthy diet and physical activity. Your health care provider may prescribe medicine if lifestyle changes are not enough to get your blood pressure under control and if: Your systolic blood pressure is above 130. Your diastolic blood pressure is above 80. Your personal target blood pressure may vary depending on your medical conditions, your age, and other factors. Follow these instructions at home: Eating and drinking  Eat a diet that is high in fiber and potassium, and low in sodium, added sugar, and fat. An example of this eating  plan is called the DASH diet. DASH stands for Dietary Approaches to Stop Hypertension. To eat this way: Eat plenty of fresh fruits and vegetables. Try to fill one half of your plate at each meal with fruits and  vegetables. Eat whole grains, such as whole-wheat pasta, brown rice, or whole-grain bread. Fill about one fourth of your plate with whole grains. Eat or drink low-fat dairy products, such as skim milk or low-fat yogurt. Avoid fatty cuts of meat, processed or cured meats, and poultry with skin. Fill about one fourth of your plate with lean proteins, such as fish, chicken without skin, beans, eggs, or tofu. Avoid pre-made and processed foods. These tend to be higher in sodium, added sugar, and fat. Reduce your daily sodium intake. Many people with hypertension should eat less than 1,500 mg of sodium a day. Do not drink alcohol if: Your health care provider tells you not to drink. You are pregnant, may be pregnant, or are planning to become pregnant. If you drink alcohol: Limit how much you have to: 0-1 drink a day for women. 0-2 drinks a day for men. Know how much alcohol is in your drink. In the [place] one drink equals one 12 oz bottle of beer (355 mL), one 5 oz glass of wine (148 mL), or one 1 oz glass of hard liquor (44 mL). Lifestyle  Work with your health care provider to maintain a healthy body weight or to lose weight. Ask what an ideal weight is for you. Get at least 30 minutes of exercise that causes your heart to beat faster (aerobic exercise) most days of the week. Activities may include walking, swimming, or biking. Include exercise to strengthen your muscles (resistance exercise), such as Pilates or lifting weights, as part of your weekly exercise routine. Try to do these types of exercises for 30 minutes at least 3 days a week. Do not use any products that contain nicotine or tobacco. These products include cigarettes, chewing tobacco, and vaping devices, such as e-cigarettes. If you need help quitting, ask your health care provider. Monitor your blood pressure at home as told by your health care provider. Keep all follow-up visits. This is important. Medicines Take  over-the-counter and prescription medicines only as told by your health care provider. Follow directions carefully. Blood pressure medicines must be taken as prescribed. Do not skip doses of blood pressure medicine. Doing this puts you at risk for problems and can make the medicine less effective. Ask your health care provider about side effects or reactions to medicines that you should watch for. Contact a health care provider if you: Think you are having a reaction to a medicine you are taking. Have headaches that keep coming back (recurring). Feel dizzy. Have swelling in your ankles. Have trouble with your vision. Get help right away if you: Develop a severe headache or confusion. Have unusual weakness or numbness. Feel faint. Have severe pain in your chest or abdomen. Vomit repeatedly. Have trouble breathing. These symptoms may be an emergency. Get help right away. Call 911. Do not wait to see if the symptoms will go away. Do not drive yourself to the hospital. Summary Hypertension is when the force of blood pumping through your arteries is too strong. If this condition is not controlled, it may put you at risk for serious complications. Your personal target blood pressure may vary depending on your medical conditions, your age, and other factors. For most people, a normal blood pressure is less than 120/80. Hypertension  is treated with lifestyle changes, medicines, or a combination of both. Lifestyle changes include losing weight, eating a healthy, low-sodium diet, exercising more, and limiting alcohol. This information is not intended to replace advice given to you by your health care provider. Make sure you discuss any questions you have with your health care provider. Document Revised: 03/11/2021 Document Reviewed: 03/11/2021 Elsevier Patient Education  2024 [employer].

## 2024-02-02 NOTE — Progress Notes (Signed)
 Subjective:  Patient ID: Matthew LITTIE Butler Mickey., male    DOB: 04-27-1981  Age: 43 y.o. MRN: [id number]  CC: Annual Exam (Blood pressure. ) and Hypertension   HPI Matthew LITTIE Butler Mickey. presents for a CPX and f/up ---  Discussed the use of AI scribe software for clinical note transcription with the patient, who gave verbal consent to proceed.  History of Present Illness Matthew LITTIE Butler Mickey. Matthew Garcia is a 43 year old male with hypertension who presents for blood pressure management.  His blood pressure was previously recorded as high as 160/114, but he did not experience significant symptoms related to the elevated blood pressure, except for occasional headaches. No swelling in his legs or feet.  He is currently taking telmisartan , which has alleviated previous symptoms such as joint pain, headaches, pressure behind the eyes, and swelling of the ankles that he experienced with amlodipine . No symptoms of weakness, dizziness, or lightheadedness with his current medication regimen.  His home blood pressure readings have varied, with a recent reading of 125/80, though he questions the accuracy of his home device.  He maintains an active lifestyle, working every day and planning to return to the gym. He acknowledges some weight gain and a lack of exercise over the summer but denies any changes in alcohol consumption or smoking habits.     Outpatient Medications Prior to Visit  Medication Sig Dispense Refill   alfuzosin (UROXATRAL) 10 MG 24 hr tablet Take 10 mg by mouth at bedtime.     telmisartan  (MICARDIS ) 20 MG tablet Take 1 tablet (20 mg total) by mouth daily. 30 tablet 1   testosterone  cypionate (DEPOTESTOSTERONE CYPIONATE) 200 MG/ML injection Inject into the muscle once a week.     amLODipine  (NORVASC ) 5 MG tablet Take 1 tablet (5 mg total) by mouth daily. (Patient not taking: Reported on 02/02/2024) 90 tablet 1   clomiPHENE  (CLOMID ) 50 MG tablet Take 25 mg by mouth daily. (Patient not  taking: Reported on 02/02/2024)     No facility-administered medications prior to visit.    ROS Review of Systems  Objective:  BP 118/78 (BP Location: Left Arm, Patient Position: Sitting, Cuff Size: Normal)   Pulse 67   Temp 98.6 F (37 C) (Oral)   Resp 16   Ht 5' 10 (1.778 m)   Wt 216 lb 3.2 oz (98.1 kg)   SpO2 97%   BMI 31.02 kg/m   BP Readings from Last 3 Encounters:  02/02/24 118/78  01/04/24 132/78  06/09/22 120/80    Wt Readings from Last 3 Encounters:  02/02/24 216 lb 3.2 oz (98.1 kg)  01/04/24 220 lb (99.8 kg)  06/09/22 201 lb (91.2 kg)    Physical Exam Vitals reviewed.  Constitutional:      Appearance: Normal appearance.  Cardiovascular:     Comments: EKG--- NSR, 62 bpm No LVH, Q waves, or ST/T wave changes  No old to compare  Musculoskeletal:     Right lower leg: No edema.     Left lower leg: No edema.  Neurological:     Mental Status: He is alert.     Lab Results  Component Value Date   WBC 6.7 01/04/2024   HGB 15.3 01/04/2024   HCT 45.1 01/04/2024   PLT 157.0 01/04/2024   GLUCOSE 98 01/04/2024   CHOL 193 01/04/2024   TRIG 134.0 01/04/2024   HDL 35.20 (L) 01/04/2024   LDLCALC 131 (H) 01/04/2024   ALT 20 01/04/2024   AST 20 01/04/2024  NA 137 01/04/2024   K 3.9 01/04/2024   CL 102 01/04/2024   CREATININE 1.01 01/04/2024   BUN 9 01/04/2024   CO2 27 01/04/2024   TSH 3.69 01/04/2024   PSA 0.31 04/20/2017    No results found.  Assessment & Plan:  Primary hypertension -     EKG 12-Lead  Screening for colon cancer -     Cologuard  Encounter for general adult medical examination with abnormal findings  Immunization due -     Heplisav-B  (HepB-CPG) Vaccine     Follow-up: Return in about 6 months (around 08/01/2024).  Debby Molt, MD

## 2024-08-01 ENCOUNTER — Ambulatory Visit: Admitting: Internal Medicine
